# Patient Record
Sex: Male | Born: 1940 | Race: White | Hispanic: No | Smoking: Never smoker
Health system: Southern US, Community
[De-identification: ages and names within clinical notes are randomized; demographics above are authoritative.]

## PROBLEM LIST (undated history)

## (undated) DIAGNOSIS — I10 Essential (primary) hypertension: Secondary | ICD-10-CM

## (undated) DIAGNOSIS — I499 Cardiac arrhythmia, unspecified: Secondary | ICD-10-CM

## (undated) DIAGNOSIS — I2510 Atherosclerotic heart disease of native coronary artery without angina pectoris: Secondary | ICD-10-CM

## (undated) DIAGNOSIS — E785 Hyperlipidemia, unspecified: Secondary | ICD-10-CM

## (undated) HISTORY — DX: Cardiac arrhythmia, unspecified: I49.9

## (undated) HISTORY — DX: Essential (primary) hypertension: I10

## (undated) HISTORY — DX: Atherosclerotic heart disease of native coronary artery without angina pectoris: I25.10

## (undated) HISTORY — DX: Hyperlipidemia, unspecified: E78.5

---

## 2001-10-17 ENCOUNTER — Encounter: Payer: Self-pay | Admitting: Emergency Medicine

## 2001-10-17 ENCOUNTER — Inpatient Hospital Stay (HOSPITAL_COMMUNITY): Admission: EM | Admit: 2001-10-17 | Discharge: 2001-10-21 | Payer: Self-pay | Admitting: Emergency Medicine

## 2002-05-11 ENCOUNTER — Ambulatory Visit (HOSPITAL_COMMUNITY): Admission: RE | Admit: 2002-05-11 | Discharge: 2002-05-12 | Payer: Self-pay | Admitting: Interventional Cardiology

## 2002-05-11 ENCOUNTER — Encounter: Payer: Self-pay | Admitting: Interventional Cardiology

## 2006-07-11 ENCOUNTER — Emergency Department (HOSPITAL_COMMUNITY): Admission: EM | Admit: 2006-07-11 | Discharge: 2006-07-11 | Payer: Self-pay | Admitting: Emergency Medicine

## 2007-10-27 ENCOUNTER — Ambulatory Visit (HOSPITAL_BASED_OUTPATIENT_CLINIC_OR_DEPARTMENT_OTHER): Admission: RE | Admit: 2007-10-27 | Discharge: 2007-10-27 | Payer: Self-pay | Admitting: Orthopedic Surgery

## 2007-11-12 ENCOUNTER — Ambulatory Visit (HOSPITAL_BASED_OUTPATIENT_CLINIC_OR_DEPARTMENT_OTHER): Admission: RE | Admit: 2007-11-12 | Discharge: 2007-11-12 | Payer: Self-pay | Admitting: Orthopedic Surgery

## 2008-01-09 ENCOUNTER — Ambulatory Visit (HOSPITAL_COMMUNITY): Admission: RE | Admit: 2008-01-09 | Discharge: 2008-01-09 | Payer: Self-pay | Admitting: Surgery

## 2009-07-16 ENCOUNTER — Emergency Department (HOSPITAL_COMMUNITY): Admission: EM | Admit: 2009-07-16 | Discharge: 2009-07-16 | Payer: Self-pay

## 2009-10-10 ENCOUNTER — Ambulatory Visit (HOSPITAL_BASED_OUTPATIENT_CLINIC_OR_DEPARTMENT_OTHER): Admission: RE | Admit: 2009-10-10 | Discharge: 2009-10-10 | Payer: Self-pay | Admitting: Orthopedic Surgery
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
## 2010-11-22 LAB — BASIC METABOLIC PANEL
BUN: 12 mg/dL (ref 6–23)
Chloride: 105 mEq/L (ref 96–112)
Creatinine, Ser: 0.9 mg/dL (ref 0.4–1.5)
Glucose, Bld: 113 mg/dL — ABNORMAL HIGH (ref 70–99)
Potassium: 4.4 mEq/L (ref 3.5–5.1)

## 2010-12-06 LAB — DIFFERENTIAL
Basophils Absolute: 0.1 10*3/uL (ref 0.0–0.1)
Eosinophils Absolute: 0.1 10*3/uL (ref 0.0–0.7)
Eosinophils Relative: 1 % (ref 0–5)
Lymphs Abs: 2.1 10*3/uL (ref 0.7–4.0)
Monocytes Absolute: 0.5 10*3/uL (ref 0.1–1.0)

## 2010-12-06 LAB — CBC
HCT: 43.4 % (ref 39.0–52.0)
MCHC: 34.4 g/dL (ref 30.0–36.0)
MCV: 91.3 fL (ref 78.0–100.0)
Platelets: DECREASED 10*3/uL (ref 150–400)
RDW: 13.2 % (ref 11.5–15.5)

## 2010-12-06 LAB — BASIC METABOLIC PANEL
BUN: 19 mg/dL (ref 6–23)
CO2: 26 mEq/L (ref 19–32)
Chloride: 107 mEq/L (ref 96–112)
Glucose, Bld: 101 mg/dL — ABNORMAL HIGH (ref 70–99)
Potassium: 4.2 mEq/L (ref 3.5–5.1)

## 2011-01-16 NOTE — Op Note (Signed)
Tyrone Gonzales, Tyrone Gonzales              ACCOUNT NO.:  [id number]   MEDICAL RECORD NO.:  [id number]          PATIENT TYPE:  AMB   LOCATION:  DSC                          FACILITY:  [facility]   PHYSICIAN:  Feliberto Gottron. Turner Daniels, M.D.   DATE OF BIRTH:  01/10/1941   DATE OF PROCEDURE:  11/12/2007  DATE OF DISCHARGE:                               OPERATIVE REPORT   PREOPERATIVE DIAGNOSIS:  Left carpal tunnel syndrome.   POSTOPERATIVE DIAGNOSIS:  Left carpal tunnel syndrome.   PROCEDURE:  Left carpal tunnel release.   SURGEON:  Feliberto Gottron. Turner Daniels, MD.   FIRST ASSISTANT:  None.   ANESTHETIC:  General LMA.   ESTIMATED BLOOD LOSS:  Minimal.   FLUID REPLACEMENT:  500 ml of crystalloid.   TOURNIQUET TIME:  12 minutes.   INDICATIONS FOR PROCEDURE:  The patient is a 70 year old gentleman, who  underwent a right carpal tunnel release about two weeks ago that  interestingly got rid of both his hand and his shoulder pain.  He has  similar findings on the left side.   DICTATION ENDED AT THIS POINT      Feliberto Gottron. Turner Daniels, M.D.     Ovid Curd  D:  11/12/2007  T:  11/12/2007  Job:  [id number]

## 2011-01-16 NOTE — Op Note (Signed)
Tyrone Gonzales, Tyrone Gonzales              ACCOUNT NO.:  [id number]   MEDICAL RECORD NO.:  [id number]          PATIENT TYPE:  AMB   LOCATION:  DSC                          FACILITY:  [facility]   PHYSICIAN:  Feliberto Gottron. Turner Daniels, M.D.   DATE OF BIRTH:  18-May-1941   DATE OF PROCEDURE:  11/12/2007  DATE OF DISCHARGE:                               OPERATIVE REPORT   PREOPERATIVE DIAGNOSIS:  Carpal tunnel syndrome.   POSTOPERATIVE DIAGNOSIS:  Carpal tunnel syndrome.   PROCEDURE:  Left carpal tunnel release.   SURGEON:  Feliberto Gottron. Turner Daniels, M.D.   ASSISTANT:  None.   ANESTHESIA:  General LMA.   ESTIMATED BLOOD LOSS:  Minimal.   FLUIDS REPLACED:  500 mL of crystalloid.   TOURNIQUET TIME:  12 minutes.   INDICATIONS FOR PROCEDURE:  A 70 year old man who underwent a successful  right carpal tunnel release two weeks ago that got rid of both his hand  and right shoulder neck pain.  He has moderate to severe carpal tunnel  on the left side by EMG and desires similar carpal tunnel release.  Risks and benefits of surgery are well known to the patient.   DESCRIPTION OF PROCEDURE:  The patient was identified by armband and  taken to the operating room at [facility].  Appropriate  anesthetic monitors are attached.  General LMA anesthesia induced with  the patient in supine position.  Tourniquet applied to the left arm and  left upper extremity prepped and draped in the usual sterile fashion  from the fingertips to the tourniquet.  Limb wrapped with an Esmarch  bandage.  The tourniquet inflated to 250 mmHg and I began the procedure  by making a palmar longitudinal incision starting at the wrist flexion  crease, going distally for 3 cm just through the skin and subcutaneous  tissue and down to the transverse palmar fascia distally.  A small nick  was made in the transverse palmar fascia entering the carpal tunnel.  A  Freer elevator was then passed into the carpal tunnel and kept volar.  We cut down  on the [facility] elevator, performed the carpal tunnel release,  and extended the incision up into the forearm fascia with the black  handled scissors.  Having thus released the carpal tunnel, we examined  the median nerve and tendons and found them to be in good condition.  There are no ganglia masses in the tunnel and the wound was irrigated  out with normal saline solution.  We then  closed the skin only with running 4-0 Monocryl suture and a dressing of  Xeroform, 4x4 dressing, sponges, 2-inch Webril, and 2-inch Ace wrap  applied.  The tourniquet let down.  The patient awakened and taken to  the recovery room without difficulty.      Feliberto Gottron. Turner Daniels, M.D.  Electronically Signed     FJR/MEDQ  D:  11/12/2007  T:  11/12/2007  Job:  [id number]

## 2011-01-16 NOTE — Op Note (Signed)
[name], Tyrone Gonzales              ACCOUNT NO.:  [id number]   MEDICAL RECORD NO.:  [id number]          PATIENT TYPE:  AMB   LOCATION:  DSC                          FACILITY:  [facility]   PHYSICIAN:  Feliberto Gottron. Turner Daniels, M.D.   DATE OF BIRTH:  12-07-1940   DATE OF PROCEDURE:  10/27/2007  DATE OF DISCHARGE:                               OPERATIVE REPORT   PREOPERATIVE DIAGNOSIS:  Right carpal tunnel syndrome.   POSTOPERATIVE DIAGNOSIS:  Right carpal tunnel syndrome.   PROCEDURE:  Right carpal tunnel release.   SURGEON:  Feliberto Gottron.  Turner Daniels, MD.   FIRST ASSISTANT:  None.   ANESTHETIC:  General LMA.   ESTIMATED BLOOD LOSS:  Minimal.   FLUID REPLACEMENT:  500 mL crystalloid.   TOURNIQUET TIME:  10 minutes.   INDICATIONS FOR PROCEDURE:  [name] with EMG-proven  severe right and left carpal tunnel syndrome with a continuous numbness  and tingling to the fingers of his right hand and pain that wakes him up  at night.  He desires elective right carpal tunnel release.  Risks and  benefits of surgery discussed, questions answered.   DESCRIPTION OF PROCEDURE:  The patient identified by armband, taken to  the operating room at [facility].  Appropriate anesthetic monitors were attached and general LMA  anesthesia induced with the patient in supine position.  Tourniquet  applied to the right form and the right upper extremity prepped and  draped in the usual sterile fashion from the fingertips to the  tourniquet, limb wrapped with an Esmarch bandage, tourniquet inflated to  300 mmHg.  We began the procedure by making a palmar midline incision  starting at the wrist flexion crease, going just to the ulnar side of  the thenar crease over a distance of about 4 cm.  Small bleeders in the  skin and subcutaneous tissue identified and cauterized using Senn  retractors, traction was applied to the skin identifying the transverse  palmar fascia  distally.  This was incised just into the carpal tunnel  longitudinally, allowing passage of a Freer elevator into the volar  aspect of the carpal tunnel.  We then cut down on the [facility] elevator,  performing the carpal tunnel release and then ran the fascial incision  up into the forearm fascia with a black-handled scissors.  We then  evaluated the median nerve which did have an hour glass configuration as  it went under the transverse carpal ligament and the flexor tendons were  noted to be intact.  At this point, the wound  was irrigated out with normal saline solution and the skin only was  closed with running 5-0 nylon suture, dressing of Xeroform, 4x4 dressing  sponges, 2-inch Webril, 2-inch Ace wrap was then applied.  Tourniquet  let down.  The patient awakened and taken to the recovery room without  difficulty.      Feliberto Gottron. Turner Daniels, M.D.  Electronically Signed     FJR/MEDQ  D:  10/27/2007  T:  10/27/2007  Job:  [id number]

## 2011-01-16 NOTE — Op Note (Signed)
NAMEJERMARION, Tyrone Gonzales              ACCOUNT NO.:  [id number]   MEDICAL RECORD NO.:  [id number]          PATIENT TYPE:  AMB   LOCATION:  SDS                          FACILITY:  [facility]   PHYSICIAN:  Tyrone Gonzales, M.D. DATE OF BIRTH:  Oct 25, 1940   DATE OF PROCEDURE:  01/09/2008  DATE OF DISCHARGE:  01/09/2008                               OPERATIVE REPORT   PREOPERATIVE DIAGNOSIS:  Recurrent right inguinal hernia.   POSTOPERATIVE DIAGNOSIS:  Recurrent right inguinal hernia.   PROCEDURE PERFORMED:  Open repair of recurrent right inguinal hernia  with mesh.   SURGEON:  Tyrone Arms. Corliss Skains, MD   ANESTHESIA:  General.   INDICATIONS:  The patient is a 70 year old male who underwent an open  right inguinal hernia repair in 1983.  Over the last 3-4 years, the  patient has noticed enlarging bulge in his right groin which has caused  some discomfort.  He presents now for elective repair.   DESCRIPTION OF PROCEDURE:  The patient was brought to the operating  room, placed in a supine position on the operating room table.  After an  adequate level of general anesthesia was obtained, the patient's right  groin was shaved, prepped with Betadine and draped in sterile fashion.  A time-out was taken to ensure the proper patient and proper procedure.  An incision was made above the right inguinal ligament after  infiltrating with 0.25% Marcaine with epinephrine.  Dissection was  carried down into the subcutaneous tissues with cautery.  We encountered  some scar tissue superficial to the external oblique fascia.  We cleared  off all the scar tissue and exposed the external oblique fascia.  There  is a large hernia sac seen protruding through the external ring.  We  opened the external oblique fascia down to the external ring.  We were  able to bluntly dissect around the spermatic cord.  This was retracted  with a Penrose drain.  A very large indirect hernia sac was identified.  This was peeled off  of the spermatic cord.  We had to loosen up the  internal ring slightly to allow reduction of the entire hernia sac.  The  internal ring was then tightened with a 2-0 Vicryl suture.  We then  examined the floor of the inguinal canal.  There was another direct  defect.  We dissected the hernia sac free and reduced this up through  the direct defect.  The floor of the inguinal canal was reapproximated  with a 0 PDS suture.  UltraPro mesh was cut in the keyhole shape and  secured with 2-0 Prolene beginning at the pubic tubercle.  We ran this  along Cooper's ligament and the internal oblique fascia.  The tails were  sutured together behind the spermatic cord and tucked underneath the  external oblique fascia.  The external oblique was then reapproximated  with 2-0 Vicryl, 3-0 Vicryl was used to close the subcutaneous tissues  and  4-0 Monocryl was used to close the skin.  The Steri-Strips and clean  dressings were applied.  The patient was extubated and brought to the  recovery room in stable condition.  All sponge, instrument, and needle  counts were correct.      Tyrone Arms. Tsuei, M.D.  Electronically Signed     MKT/MEDQ  D:  01/09/2008  T:  01/09/2008  Job:  [id number]   cc:   Lyn Records, M.D.
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
## 2011-01-19 NOTE — Cardiovascular Report (Signed)
[place]. [facility]  Patient:    Tyrone, Gonzales Visit Number: [id number] MRN: [id number]          Service Type: MED Location: [facility]UA [id number] Attending Physician:  Corliss Marcus Dictated by:   Francisca December, M.D. Proc. Date: 10/17/01 Admit Date:  10/17/2001   CC:         Tyrone Gonzales, M.D.  Tyrone Gonzales, M.D.   Cardiac Catheterization  PROCEDURES PERFORMED. 1. Left heart catheterization. 2. Coronary angiography. 3. Left ventriculogram. 4. Percutaneous coronary thrombectomy distal right femoral artery. 5. Percutaneous transluminal coronary angioplasty and stent implantation    distal right coronary artery. 6. Percutaneous transluminal coronary angioplasty and stent implantation    proximal right coronary artery.  SURGEON:  Francisca December, M.D.  INDICATIONS:  Mr. Tyrone Gonzales is a 70 year old male with known atherosclerotic cardiovascular disease now seven years status post PTCA and stent implantation of the large circumflex marginal branch.  He has done well until approximately 7:30 this morning when he developed the acute onset of anterior substernal crushing chest pain.  He presented to [facility] Emergency Room within two hours with diaphoresis, chest pain, and nausea.  He was having an acute inferior wall myocardial infarction by ECG.  He was transferred urgently to the cardiac catheterization laboratory for anticipated direct angioplasty.  PROCEDURE NOTE:  The patient was brought to the cardiac catheterization laboratory where both groins were prepped and draped in the usual sterile fashion.  Local anesthesia was obtained with the infiltration of 1% lidocaine into the right femoral canal.  A 7-French catheter sheath was introduced percutaneously into the right femoral artery utilizing an anterior approach over a guiding J wire through an 18-gauge thin-wall Gonzales, utilizing an anterior approach.  A 5-French #4 right Judkins catheter  was advanced to the ascending aorta where coronary angiography of the right coronary artery was conducted in multiple RAO and LAO projections.  This catheter was exchanged for a 5-French #4 left Judkins catheter.  Cine angiography of the left coronary artery was conducted in multiple LAO and RAO projections.  The left Judkins catheter was exchanged for a 5-French 110 cm pigtail catheter.  This was used to measure pressures within the ascending aorta and in the left ventricle both prior to and following the ventriculogram.  A 30-degree RAO cine left ventriculogram was performed utilizing the power injector. We then proceeded with coronary intervention.  The pigtail catheter was exchanged for a 7-French #4 FR Medtronic Launcher guiding catheter.  The right coronary os was engaged, and a 0.014 inch Scimed Luge intracoronary guidewire was passed across the lesion in the distal right coronary without difficulty. Initial patency in the artery was achieved using the pulseless AngioJet XMI catheter.  Three passes were performed from distal to proximal.  Prior to this, a temporary pacing wire had been placed in the right ventricular apex via a 6-French catheter sheath inserted into the right femoral vein utilizing an anterior approach over a guiding J wire.  The patient did require some pacing backup at 60 beats per minute during the pulseless AngioJet.  These maneuvers resulted in restoration of antegrade flow in the right coronary, and it revealed a rather focal dissection in the distal portion of the right coronary before the bifurcation in the posterolateral segment and posterior descending artery, both of which were rather small.  A 2.5/13 mm ACS Guidant Pixel intracoronary stent was advanced into the distal right coronary and deployed there at 14  atmospheres for approximately 1 minute.  This resolved the dissection, was otherwise patent, and resulted in wide patency of the distal right  coronary.  At this point, that balloon catheter was removed, and a 3.0/8 mm Scimed Express 2 intracoronary stent was advanced into a 75% stenosis in the proximal portion of the right coronary. That was deployed there at peak pressure of 16 atmospheres for 45 seconds. During this time, the patient received multiple intracoronary injections of nitroglycerin.  He required intermittent pacing backup at 50 to 60 beats per minute.  There was some relative hypertension.  I did administer 1 mg of atropine.  He also received 5000 units of heparin in the emergency room, 2000 units of heparin after arriving in the catheterization laboratory when his ACT was 229 seconds; and at close, his ACT was 202 seconds, and he received an additional 2000 units of heparin.  He also received a double bolus of Integrilin in constant infusion.  Finally, he was randomized in the AME trial, two pulseless AngioJet after getting informed consent prior to initiation of the procedure and prior to the administration of any intravenous benzodiazepines or narcotics.  The patient tolerated the procedure well with some transient hypertension that resolved by the completion of the procedure.  There was resolution of his ST segment elevation.  He was transported to the recovery area in stable condition with an intact distal pulse.  The sheaths remained sutured in place.  HEMODYNAMICS:  Systemic arterial pressure was 121/77 with a mean of 99 mmHg. There was no systolic gradient across the aortic valve.  The left ventricular end-diastolic pressure was 23 mmHg pre ventriculogram and 19 mmHg post ventriculogram.  ANGIOGRAPHY:  The left ventriculogram demonstrated normal chamber size and normal global systolic function with estimated ejection fraction of 70%. There was inferobasal-to-mid-portion-of-the-LV akinesis.  There was no significant mitral regurgitation.  There was some left coronary calcification, and the previously  placed stent was easily visible.   There was a right-dominant coronary system present.  The main left coronary artery was normal.  The left anterior descending artery and its branches were mildly diffusely diseased.  Two small diagonal branches arise proximally and then a moderate to large size third diagonal branch arises at the origin of the second septal perforator.  The anterior descending artery ongoing is diffusely diseased but not greater than 30 to 40% in the mid to distal segment.  The vessel does traverse the apex.  Luminal irregularities only are seen in the proximal segment.  The left circumflex artery and its branches were mildly diseased; there is a very small ramus intermedius branch.  Then a small first marginal branch arises.  No significant obstruction is seen in these vessels.  The stented segment is in the proximal portion of a very large and dominant marginal branch.  Just distal to this stent, there is a 30 to 40% stenosis.  The vessel then trifurcates on the lateral obtuse margin of the heart, the more superior subbranch of which is diffusely diseased and has multiple 50 to 60% narrowings.  The right coronary artery and its branches are highly diseased; the vessel has a proximal 30% stenosis followed immediately by a 75% stenosis.  The mid portion of the vessel is diffusely diseased, at least 50%, and extends over about 2 cm.  The distal portion of the right coronary is the location of the index infarction lesion which is 99% stenotic, and there is evidence of vascular thrombus.  The vessel bifurcates into  a small posterolateral segmented branch and into the posterior descending artery which is moderate in size.  Following pulseless AngioJet thrombectomy and stent implantation in the distal portion of the right coronary, there is no significant residual stenosis and TIMI-3 flow.  Following stent implantation in the proximal 75% stenosis, there is no residual  narrowing.  The vessel remains diffusely diseased in the mid portion.  I did not treat this segment in order to avoid a "full metal jacket" and higher likelihood of restenosis in this setting.  Finally, it should be noted that, when I initially did angiography, there was no distal flow, and the vessel was completely occluded in the distal segment. This was TIMI-0 flow.  FINAL DIAGNOSES: 1. Atherosclerotic cardiovascular disease, two vessel. 2. Acute inferior wall myocardial infarction successful treated with direct    percutaneous transluminal coronary angioplasty. 3. Successful pulseless AngioJet thrombectomy and stent implantation in the    distal right coronary. 4. Successful stent implantation proximal right coronary artery. 5. Intact left ventricular size and global systolic function with regional    wall motion abnormality as noted. 6. The patients chest discomfort was relieved following this procedure.  FINAL IMPRESSION: 1. Nonobstructive atherosclerotic cardiovascular disease. 2. Intact left ventricular size and global systolic function with mild    regional wall motion abnormalities noted. 3. Normal left ventricular end diastolic pressure. 4. Mild systemic hypertension.  PLAN/RECOMMENDATIONS: It does not appear that the patients out-of-hospital cardiac arrest can be attributed to coronary ischemia.  Will obtain an electrophysiologic consultation for consideration of EP study and likely AICD implant. Dictated by:   Francisca December, M.D. Attending Physician:  Corliss Marcus DD:  10/17/01 TD:  10/17/01 Job: [id number]

## 2011-01-19 NOTE — Cardiovascular Report (Signed)
NAME:  Tyrone Gonzales, Tyrone Gonzales                        ACCOUNT NO.:  [id number]   MEDICAL RECORD NO.:  [id number]                   PATIENT TYPE:  OIB   LOCATION:  6525                                 FACILITY:  [facility]   PHYSICIAN:  Lyn Records III, M.D.            DATE OF BIRTH:  27-Jan-1941   DATE OF PROCEDURE:  05/11/2002  DATE OF DISCHARGE:                              CARDIAC CATHETERIZATION   INDICATIONS FOR PROCEDURE:  The patient has a history of coronary artery  disease and is status post circumflex coronary stent in 1996 and is also  status post proximal and distal RCA stent implantation during acute inferior  infarction by Dr. Amil Amen in February 2003.  He is asymptomatic at this time  but because of the high risk of restenosis in the right coronary, I have  chosen coronary angiography as a means to rule out restenosis.   PROCEDURE PERFORMED:  1. Left heart catheterization.  2. Selective coronary angiography.  3. Left ventriculography.  4. Direct stent, distal right coronary artery for high-grade restenosis.   DESCRIPTION OF PROCEDURE:  After informed consent, a #6 French sheath was  placed in the right femoral artery using the modified Seldinger technique.  A #6 French A2 multipurpose catheter was used for hemodynamic recordings,  left ventriculography, and selective left and right coronary angiography.  Intracoronary nitroglycerin, 200 mcg, was administered down the right  coronary with a #6 French sidehole guide catheter.  After reviewing the  cineangiograms, it was felt that there was still moderately severe disease  in the mid RCA and that there was a high-grade 99% stenosis at the distal  margin of the distal RCA stent.  This did not appear to be in-stent but was  within the probable treatment zone and is likely due to trauma from the  prior interventional procedure in February.   After reviewing the cineangiograms, it was felt that PCI on the distal RCA  was  indicated.  A BMW wire was used.  Heparin, 3500 units, was administered  intravenously and a double bolus followed by an infusion of Integrilin was  started.  Direct stenting with a 2.25 mm diameter, 8 mm long Pixel stent was  performed with inflation to a peak pressure of 15 atmospheres which was an  effective stent diameter of 2.4 mm.  The patient tolerated the procedure  without complications.  ACT post procedure was 272 seconds.   In the process of taking post stent angiograms, the shield covering became  entangled in the patient's sheath and pulled the sheath out of the patient's  artery.  Manual hemostasis was started in the catheterization lab.  A  FemStop was placed.  The patient will be watched in the catheterization  holding area until the ACT is in a range that would ordinarily allow sheath  removal and hemostasis is assured.   RESULTS:  1. Hemodynamic data     A. Aortic  pressure 141/84.     B. Left ventricular pressure 143/6.  2. Left ventriculography:  The left ventricle is normal in size.  There is     inferior hypokinesis.  EF 60%.  3. Coronary angiography     A. Left main coronary:  Normal.     B. Left anterior descending coronary:  Luminal irregularities.  Two        diagonals arise from it.  No high-grade obstruction is seen.     C. Circumflex artery:  The circumflex is diffusely diseased.  There is a        relatively long mid circumflex stent that is widely patent.  Beyond        this stent in the distal circumflex is diffuse disease with 50-70%        narrowing.  Following this segment, the circumflex trifurcates.  The        mid obtuse marginal branch of this trifurcation is diffusely diseased        with up to 90% stenosis in multiple spots.     D. Right coronary artery:  The right coronary artery contains 50-70% mid        vessel narrowing just distal to a large acute marginal branch.  The        proximal RCA stent is widely patent.  The distal RCA stent is  widely        patent and there is 99% stenosis at the distal margin of this stent.        The PDA is diffusely diseased with up to 90% stenosis in some        branches.  4. Percutaneous coronary intervention:  Following direct stenting, the     distal right coronary stenosis was reduced from 99% to 0% with brisk     antegrade flow.  After reviewing the cineangiograms, I did give some     consideration to stenting the mid right coronary but I chose not to and     will simply follow this lesion along.   CONCLUSION:  1. Severe coronary artery disease with diffuse obtuse marginal #2 disease,     moderate distal circumflex disease, and high-grade distal right coronary     artery stenosis.  There is also moderate mid right coronary disease.  2. Overall normal left ventricular function.  3. Successful direct stent, distal right coronary artery, for 99% to 0%.  4. Procedure was complicated by premature removal of the arterial sheath     requiring that manual compression be started at the completion of the     case and FemStop will also be used.   PLAN:  Clinical followup of the right coronary.  Continue other medications.  Ticlid will be used in lieu of Plavix as the patient has a possible Plavix  allergy.  Integrilin x 18 hours.                                               [name], M.D.    HWS/MEDQ  D:  05/11/2002  T:  05/11/2002  Job:  [id number]   cc:   Aura Dials, M.D.   Annia Belt, M.D.

## 2011-01-19 NOTE — H&P (Signed)
[place]. [facility]  Patient:    Tyrone Gonzales, Tyrone Gonzales Visit Number: [id number] MRN: [id number]          Service Type: MED Location: [id number] [id number] Attending Physician:  Lorre Nick Dictated by:   Anselm Lis, N.P. Admit Date:  10/17/2001   CC:         Dr. Wynema Birch in [place]                         History and Physical  DATE OF BIRTH:  1941/09/03  PRIMARY CARE Malana Eberwein:  Dr. Wynema Birch in [place], phone number [phone].  PRIMARY CARDIOLOGIST:  Dr. Darci Needle.  IMPRESSION:  (as dictated by Dr. Corliss Marcus) 1. Acute inferior myocardial infarction in a 70 year old gentleman with known    history of coronary artery disease in that he is status post stent to    the circumflex in 1996.  He does have residual nonobstructive left    anterior descending artery and right coronary artery disease with normal    ejection fraction.  He has a positive family history of coronary artery    disease, personal history of dislipidemia and hypertension.  He    continues with chest discomfort, onset about 7:15 a.m.; time of evaluation    approximately 9 a.m.  He is status post 5000 IV heparin bolus,    nitroglycerin sublingual, IV nitroglycerin drip, and 150 mg of Plavix. 2. History of dislipidemia; on Lipitor, managed by primary care. 3. Recent onset of bronchitis for which he is taking amoxicillin and    guaifenesin.  PLAN:  (as dictated by Dr. Corliss Marcus) Patient taken urgently to cardiac catheterization for coronary angiography, left ventriculogram, and probable percutaneous intervention if indicated and able.  Risks, potential complications, benefits, and alternatives to procedure discussed with Tyrone Gonzales.  Patient indicates his questions and concerns have been addressed and is agreeable to proceed.  HISTORY OF PRESENT ILLNESS:  Tyrone Gonzales is a 70 year old gentleman with khx of CAD (stent circumflex 1996), positive family  history of CAD, and personal history of dislipidemia and hypertension.  He developed onset of diaphoresis/shortness of breath/across anterior chest discomfort with a feeling of malaise.  Pain described as a 10/10.  He called a neighbor and then subsequently 911.  He did take a couple of sublingual nitrates without appreciable change.  Enroute he received four baby aspirin.  At [facility] emergency room EKG changes were consistent with acute inferior myocardial infarction with reciprocal changes.  He was given 5000 IV heparin, sublingual nitrates, IV morphine, and IV nitro drip.  He was given 150 mg of Plavix.  He was taken urgently for coronary angiography.  PREVIOUS MEDICAL HISTORY: 1. Coronary atherosclerotic heart disease.    a. (August 1996) Acute myocardial infarction with subsequent stent in       the proximal circumflex.  He had residual 70% distal LAD, 50% proximal       LAD.  Diffusely diseased proximal to mid RCA up to 50%.  EF 70%.  No MR.       Mild inferior hypokinesis.    b. Stress Cardiolite February 2002 which was negative for ischemia.       EF 69%. 2. History of dislipidemia. 3. History of hypertension for seven years.  PAST SURGICAL HISTORY:  Was not obtained but patient states is extensive.  ALLERGIES:  CODEINE, causing nausea.  MEDICATIONS: 1. Amoxicillin 500 mg one p.o.  t.i.d. started October 14, 2001. 2. Metoprolol 50 mg one-half p.o. b.i.d. 3. Guaifenesin /DM one to two p.o. b.i.d. p.r.n. congestion. 4. Prevacid 15 mg p.o. q.d. 5. Lipitor 20 mg p.o. q.d. 6. Aspirin 81 mg p.o. q.d. 7. Sublingual nitrate 0.4 mg p.r.n. 8. P.r.n. Viagra.  SOCIAL HISTORY AND HABITS:  Patient is divorced.  No children.  He has a friend, Creta Levin, that works at [employer].  His brother, Dequavious Harshberger, lives in [place], I believe.  FAMILY HISTORY:  Positive for stroke and CAD.  REVIEW OF SYSTEMS:  Some bronchitis-type symptoms for which he has been  taking guaifenesin and amoxicillin, started October 10, 2001.  He had previously been complaining of burning chest discomfort, getting better with antacids and not exertion related.  Denies orthopnea, DOE.  PHYSICAL EXAMINATION:  (as performed by Dr. Corliss Marcus)  VITAL SIGNS:  Blood pressure 102/60, heart rate 50s, respiratory rate 18.  GENERAL:  He is a well-nourished, anxious-appearing, pale 70 year old gentleman with ongoing chest discomfort.  HEENT:  Brisk bilateral carotid upstroke without bruit.  No significant JVD nor thyromegaly.  CARDIAC:  Regular rate and rhythm without murmur, rub, nor gallop.  Normal S1 and S2.  CHEST:  Lung sounds clear with equal bilateral excursion.  ABDOMEN:  Soft, nondistended, normal active bowel sounds.  Negative abdominal aortic, renal, nor femoral bruit appreciated.  No masses nor organomegaly. Nontender to applied pressure.  EXTREMITIES:  Pedal pulses decreased.  Negative pedal edema.  Lower extremities are cool, as is the rest of his body.  NEUROLOGIC:  Cranial nerves II-XII grossly intact.  Alert and oriented x 3.  GENITAL/RECTAL:  Deferred.  LABORATORY TESTS AND DATA:  Sodium 138, potassium 4.0, chloride 106, CO2 24, BUN 10, creatinine 1.1, glucose 125.  Hemoglobin 14.8, hematocrit 42, WBC 9.6, platelets 237.  Protime 14.4, INR 1.2, PTT 30.  CK 33.  Chest x-ray revealed no active disease.  EKG revealed sinus bradycardia at 54 beats per minute with ST elevation inferior and lateral/precordial leads consistent with acute inferior MI. Reciprocal ST depression I, aVL, and anterior leads. Dictated by:   Anselm Lis, N.P. Attending Physician:  Lorre Nick DD:  10/17/01 TD:  10/17/01 Job: [id number]

## 2011-01-19 NOTE — Discharge Summary (Signed)
[place]  Patient:    Tyrone Gonzales, Tyrone Gonzales Visit Number: [id number] MRN: [id number]          Service Type: MED Location: 2000 2004 01 Attending Physician:  Tyrone Gonzales Dictated by:   Tyrone Gonzales, N.P. Admit Date:  10/17/2001 Discharge Date: 10/21/2001   CC:         Tyrone Gonzales, M.D., [place], [place] - Urgent Care   Discharge Summary  PRIMARY CARE PHYSICIAN:  Tyrone Gonzales, Tyrone Gonzales, Tyrone Gonzales.  DATE OF BIRTH:  02-Mar-1941.  PROCEDURES:  (October 17, 2001) Stent distal RCA (AngioJet thrombectomy) and stent proximal RCA.  Residual 50% mid-RCA and 50-60% superior OM.  Preserved LV function; inferior akinesis.  EF 70%.  No MR.  DISCHARGE DIAGNOSES: 1. Coronary atherosclerotic heart disease.    a. Acute inferior myocardial infarction with peak CK of 1208, MB fraction       144, troponin I of 11.9.    b. (October 17, 2001) Stent distal right coronary artery (AngioJet       thrombectomy).  Stent proximal right coronary artery.  Residual 50%       mid right coronary artery, 50-60% superior obtuse marginal.    c. At time of catheterization left ventriculogram revealed preserved left       ventricular function with inferior akinesis, ejection fraction 70%.       No mitral regurgitation.  Medical therapy on beta blocker,       angiotensin-converting enzyme inhibitor, aspirin, Plavix, and statin. 2. Hypotension during course of admission likely related to ACE inhibitor    and beta blocker therapy.  Blood pressure as low as 70s post procedure.    Improved with decrease of drug therapy and stable at time of discharge. 3. History of dyslipidemia; on Zocor. 4. Skin rash question related to amoxicillin of which the patient was on prior    to admission.  It could also be related to contrast dye.  Amoxicillin    discontinued at time of discharge.  If he continues with this rash we will    plan to discontinue Plavix as possible cause  for it and switch to Ticlid. 5. Prehospital bronchitis; was started on amoxicillin prior to admission on    October 14, 2001.  He continued on amoxicillin during the course of    admission but this was discontinued at time of discharge because of    appearance of rash with amoxicillin as possible culprit.  His bronchitis    has significantly improved/resolved by time of discharge.  PLAN: 1. The patient discharged home in stable condition. 2. Discharge medications:    a. (NEW) Nitroglycerin tablet 0.4 mg sublingual p.r.n. chest pain.    b. Prevacid 15 mg p.o. q.d.    c. Lipitor 20 mg p.o. q.d.    d. Enteric-coated aspirin 325 mg once daily.    e. (NEW) Plavix 75 mg one p.o. q.d. take with food for 4 weeks.    f. Guaifenesin-DM one to two tabs b.i.d. p.r.n. cough.    g. (NEW) Lisinopril 5 mg p.o. q.d.    h. (NEW) Toprol XL 25 mg one p.o. q.d. (substitute for metoprolol). 3. Activity: As outlined by cardiac rehab nurse.  Recommend enrollment in    cardiac rehab phase II as an outpatient. 4. Diet: Low fat, low cholesterol. 5. Wound care: The patient to call our office if he develops a large amount of    swelling or bruising in groin area.  He will call us if his rash persists    or worsens with consideration of changing from Plavix to Ticlid as this may    be the culprit. 6. He is not to work until he is seen by Dr. Katrinka Gonzales. 7. Other special instructions: The patient to discontinue his amoxicillin. 8. Followup: Dr. Verdis Gonzales; our office will call him with that followup    office visit to be seen in about 10-14 days.  HISTORY OF PRESENT ILLNESS:  The patient is a 70 year old gentleman with known history of CAD and that he is status post stent of the circumflex in 1996. The patient developed onset of diaphoresis/shortness of breath and across anterior chest discomfort with a feeling of associated malaise.  His pain was described as a 10/10.  He called a neighbor and then subsequently  911.  He did take a couple of sublingual nitrates without appreciable change.  He received four baby aspirin en route.  [facility] he had EKG changes consistent with acute inferior myocardial infarction with reciprocal changes.  He was given IV heparin bolus, sublingual nitrates, IV morphine, and IV nitroglycerin drip.  He was also given 150 of Plavix.  He was taken urgently for coronary angiography.  HOSPITAL COURSE:  Further hospital course as above.  LABORATORY TESTS AND DATA:  Initial WBC of 9.6, hemoglobin 14.8; 12.3 at time of discharge.  Admission hematocrit of 42.  Platelets of 237; 201 at time of discharge.  Admission prothrombin time of 14.4, INR of 1.2, PTT 30.  Sodium 138, potassium of 4, chloride of 106, CO2 26, BUN 10, creatinine 1.1, glucose of 125.  Electrolytes within normal range at time of discharge.  First CK of 88, MB fraction 2.3, troponin I 0.01; second CK of 1208, MB fraction 143.7; third CK of 737, MB fraction 62.5; fourth CK of 566, MB fraction 35.4; 566, MB fraction 35.4.  Admission EKG revealed sinus bradycardia at 54 beats a minute with ST elevation inferior and lateral/precordial leads consistent with acute inferior myocardial infarction.  Reciprocal ST depression I, aVL, and anterior leads. Chest x-ray revealed no active disease.  Total time preparing this discharge greater than 40 minutes including filling out and reviewing of discharge instructions with the patient and dictating this discharge plan. Dictated by:   Tyrone Gonzales, N.P. Attending Physician:  Tyrone Gonzales DD:  10/21/01 TD:  10/21/01 Job: [id number]

## 2011-05-25 LAB — I-STAT 8, (EC8 V) (CONVERTED LAB)
Chloride: 107
HCT: 44
pCO2, Ven: 37.9 — ABNORMAL LOW
pH, Ven: 7.418 — ABNORMAL HIGH

## 2011-05-28 LAB — POCT I-STAT, CHEM 8
Chloride: 106
Creatinine, Ser: 0.9
Glucose, Bld: 110 — ABNORMAL HIGH
Hemoglobin: 14.6
Potassium: 4

## 2011-05-30 LAB — CBC
HCT: 42.7
MCHC: 34.3
MCV: 90.8
Platelets: 201
RDW: 13.1

## 2011-05-30 LAB — BASIC METABOLIC PANEL
BUN: 7
Chloride: 105
Glucose, Bld: 110 — ABNORMAL HIGH
Potassium: 4.1

## 2011-05-30 LAB — DIFFERENTIAL
Basophils Absolute: 0.1
Lymphs Abs: 1.6
Monocytes Absolute: 0.8

## 2013-10-04 ENCOUNTER — Other Ambulatory Visit: Payer: Self-pay | Admitting: *Deleted

## 2013-10-04 DIAGNOSIS — Z79899 Other long term (current) drug therapy: Secondary | ICD-10-CM

## 2013-10-04 DIAGNOSIS — E7800 Pure hypercholesterolemia, unspecified: Secondary | ICD-10-CM

## 2013-11-10 ENCOUNTER — Other Ambulatory Visit (INDEPENDENT_AMBULATORY_CARE_PROVIDER_SITE_OTHER): Payer: Medicare Other

## 2013-11-10 DIAGNOSIS — Z79899 Other long term (current) drug therapy: Secondary | ICD-10-CM

## 2013-11-10 DIAGNOSIS — E7800 Pure hypercholesterolemia, unspecified: Secondary | ICD-10-CM

## 2013-11-10 LAB — LIPID PANEL
CHOL/HDL RATIO: 3
Cholesterol: 108 mg/dL (ref 0–200)
HDL: 38.4 mg/dL — AB (ref >39.00)
LDL CALC: 46 mg/dL (ref 0–99)
TRIGLYCERIDES: 120 mg/dL (ref 0.0–149.0)
VLDL: 24 mg/dL (ref 0.0–40.0)

## 2013-11-10 LAB — HEPATIC FUNCTION PANEL
ALBUMIN: 4.2 g/dL (ref 3.5–5.2)
ALT: 23 U/L (ref 0–53)
AST: 18 U/L (ref 0–37)
Alkaline Phosphatase: 56 U/L (ref 39–117)
BILIRUBIN TOTAL: 0.8 mg/dL (ref 0.3–1.2)
Bilirubin, Direct: 0.1 mg/dL (ref 0.0–0.3)
Total Protein: 6.8 g/dL (ref 6.0–8.3)

## 2013-11-12 ENCOUNTER — Telehealth: Payer: Self-pay

## 2013-11-12 DIAGNOSIS — E785 Hyperlipidemia, unspecified: Secondary | ICD-10-CM

## 2013-11-12 NOTE — Telephone Encounter (Signed)
pt given lab results.At target. Recheck 1 year.pt verbalized understanding

## 2013-11-12 NOTE — Telephone Encounter (Signed)
Message copied by Jarvis NewcomerPARRIS-GODLEY, LISA S on Thu Nov 12, 2013  2:43 PM ------      Message from: Verdis PrimeSMITH, HENRY      Created: Thu Nov 12, 2013 11:39 AM       At target. Recheck 1 year ------

## 2014-03-04 DIAGNOSIS — I2510 Atherosclerotic heart disease of native coronary artery without angina pectoris: Secondary | ICD-10-CM | POA: Insufficient documentation

## 2014-03-04 DIAGNOSIS — I25119 Atherosclerotic heart disease of native coronary artery with unspecified angina pectoris: Secondary | ICD-10-CM

## 2014-03-04 DIAGNOSIS — R0600 Dyspnea, unspecified: Secondary | ICD-10-CM

## 2014-03-04 DIAGNOSIS — E785 Hyperlipidemia, unspecified: Secondary | ICD-10-CM | POA: Insufficient documentation

## 2014-03-04 DIAGNOSIS — I10 Essential (primary) hypertension: Secondary | ICD-10-CM | POA: Insufficient documentation

## 2014-03-16 ENCOUNTER — Encounter (INDEPENDENT_AMBULATORY_CARE_PROVIDER_SITE_OTHER): Payer: Self-pay

## 2014-03-16 ENCOUNTER — Ambulatory Visit (INDEPENDENT_AMBULATORY_CARE_PROVIDER_SITE_OTHER): Payer: Medicare Other | Admitting: Interventional Cardiology

## 2014-03-16 ENCOUNTER — Encounter: Payer: Self-pay | Admitting: Interventional Cardiology

## 2014-03-16 VITALS — BP 132/74 | HR 91 | Ht 65.0 in | Wt 159.0 lb

## 2014-03-16 DIAGNOSIS — R0989 Other specified symptoms and signs involving the circulatory and respiratory systems: Secondary | ICD-10-CM

## 2014-03-16 DIAGNOSIS — E785 Hyperlipidemia, unspecified: Secondary | ICD-10-CM

## 2014-03-16 DIAGNOSIS — I2510 Atherosclerotic heart disease of native coronary artery without angina pectoris: Secondary | ICD-10-CM

## 2014-03-16 DIAGNOSIS — I10 Essential (primary) hypertension: Secondary | ICD-10-CM

## 2014-03-16 DIAGNOSIS — R0600 Dyspnea, unspecified: Secondary | ICD-10-CM

## 2014-03-16 DIAGNOSIS — R0609 Other forms of dyspnea: Secondary | ICD-10-CM

## 2014-03-16 MED ORDER — SPIRONOLACTONE 25 MG PO TABS: ORAL_TABLET | ORAL | Status: DC

## 2014-03-16 NOTE — Patient Instructions (Signed)
Your physician has recommended you make the following change in your medication:   1. Start Aldactone 25 mg 1/2 tablet for 1 week and then increase to 1 full tablet if tolerating medication.  Your physician recommends that you return for lab work in 1 month at [facility].  Your physician recommends that you schedule a follow-up appointment in: 1 months with Dr. Katrinka BlazingSmith, Bmet before visit.

## 2014-03-16 NOTE — Progress Notes (Signed)
Patient ID: Tyrone Gonzales, male   DOB: [birth year], 73 y.o.   MRN: [id number]    [address] [place], [place]  [address] Phone: [phone] Fax:  [phone]  Date:  03/16/2014   ID:  Tyrone Gonzales, DOB [birth year], MRN [id number]  PCP:  No primary provider on file.   ASSESSMENT:  1. Coronary atherosclerosis, no angina 2. Dyspnea 3. Hypertension 4. Hyperlipidemia 5. Brief PSVT  PLAN:  1. Start Aldactone 12.5 mg daily for one week and if tolerating, increased to 25 mg per day 2. Office visit in one month 3. Basic metabolic panel in 1 month prior to the office visit   SUBJECTIVE: Tyrone Gonzales is a 73 y.o. male who continues to complain of exertional dyspnea. He's had an extensive pulmonary and allergy evaluation and no abnormalities were found. He is worried that he has heart failure. He denies angina. There is no orthopnea or edema. He is also concerned about the possibility of pulmonary hypertension. He denies palpitations and syncope. No neurological complaints.   Wt Readings from Last 3 Encounters:  03/16/14 159 lb (72.122 kg)     Past Medical History  Diagnosis Date  . Hypertension   . Hyperlipidemia   . Coronary artery disease     BMS Cfx.,1996, RCA BMS 2003  . Arrhythmia     PSVT during stress test    Current Outpatient Prescriptions  Medication Sig Dispense Refill  . aspirin 325 MG EC tablet Take 325 mg by mouth daily.      [name]. atorvastatin (LIPITOR) 20 MG tablet Take 20 mg by mouth daily.      [name]. lisinopril (PRINIVIL,ZESTRIL) 5 MG tablet Take 5 mg by mouth daily.      . metoprolol succinate (TOPROL-XL) 25 MG 24 hr tablet Take 25 mg by mouth daily.      . nitroGLYCERIN (NITROSTAT) 0.4 MG SL tablet Place 0.4 mg under the tongue every 5 (five) minutes as needed for chest pain.      [name]. omeprazole (PRILOSEC) 20 MG capsule Take 20 mg by mouth daily.      . tamsulosin (FLOMAX) 0.4 MG CAPS capsule Take 0.4 mg by mouth.       No current  facility-administered medications for this visit.    Allergies:    Allergies  Allergen Reactions  . Hornet Venom Anaphylaxis  . Bee Venom   . Codeine   . Isovue [Iopamidol]   . Ivp Dye [Iodinated Diagnostic Agents]     Social History:  The patient  reports that he has never smoked. He does not have any smokeless tobacco history on file.   ROS:  Please see the history of present illness.    All other systems reviewed and negative.   OBJECTIVE: VS:  BP 132/74  Pulse 91  Ht 5\' 5"  (1.651 m)  Wt 159 lb (72.122 kg)  BMI 26.46 kg/m2 Well nourished, well developed, in no acute distress, the patient and her stated age HEENT: normal Neck: JVD flat. Carotid bruit absent  Cardiac:  normal S1, S2; RRR; no murmur Lungs:  clear to auscultation bilaterally, no wheezing, rhonchi or rales Abd: soft, nontender, no hepatomegaly Ext: Edema absent. Pulses 2+ Skin: warm and dry Neuro:  CNs 2-12 intact, no focal abnormalities noted  EKG:  Normal sinus rhythm with PACs. Prominent voltage.       Signed, Darci NeedleHenry W. B. Ariyon Mittleman III, MD 03/16/2014 11:37 AM

## 2014-04-05 ENCOUNTER — Other Ambulatory Visit: Payer: Self-pay | Admitting: *Deleted

## 2014-04-05 ENCOUNTER — Telehealth: Payer: Self-pay | Admitting: *Deleted

## 2014-04-05 ENCOUNTER — Other Ambulatory Visit: Payer: Self-pay

## 2014-04-05 MED ORDER — LISINOPRIL 5 MG PO TABS: 5.0000 mg | ORAL_TABLET | Freq: Every day | ORAL | Status: DC

## 2014-04-05 MED ORDER — METOPROLOL SUCCINATE ER 50 MG PO TB24: 50.0000 mg | ORAL_TABLET | Freq: Every day | ORAL | Status: DC

## 2014-04-05 MED ORDER — ATORVASTATIN CALCIUM 20 MG PO TABS: 20.0000 mg | ORAL_TABLET | Freq: Every day | ORAL | Status: DC

## 2014-04-05 NOTE — Telephone Encounter (Signed)
[facility] requests metoprolol succinate 50mg  refill, but the chart has 25mg . Please advise on which is one the patient is to be taking. Thanks, MI

## 2014-04-05 NOTE — Telephone Encounter (Signed)
Dr Katrinka BlazingSmith increased to 50 MG in July of 2014. Pt should be on 50.

## 2014-04-13 ENCOUNTER — Ambulatory Visit (INDEPENDENT_AMBULATORY_CARE_PROVIDER_SITE_OTHER): Payer: Medicare Other | Admitting: Interventional Cardiology

## 2014-04-13 ENCOUNTER — Other Ambulatory Visit: Payer: Medicare Other

## 2014-04-13 ENCOUNTER — Encounter: Payer: Self-pay | Admitting: Interventional Cardiology

## 2014-04-13 VITALS — BP 145/85 | HR 61 | Ht 65.0 in | Wt 159.0 lb

## 2014-04-13 DIAGNOSIS — R0989 Other specified symptoms and signs involving the circulatory and respiratory systems: Secondary | ICD-10-CM

## 2014-04-13 DIAGNOSIS — I5032 Chronic diastolic (congestive) heart failure: Secondary | ICD-10-CM

## 2014-04-13 DIAGNOSIS — I10 Essential (primary) hypertension: Secondary | ICD-10-CM

## 2014-04-13 DIAGNOSIS — I2510 Atherosclerotic heart disease of native coronary artery without angina pectoris: Secondary | ICD-10-CM

## 2014-04-13 DIAGNOSIS — R0600 Dyspnea, unspecified: Secondary | ICD-10-CM

## 2014-04-13 DIAGNOSIS — R0609 Other forms of dyspnea: Secondary | ICD-10-CM

## 2014-04-13 NOTE — Progress Notes (Signed)
Patient ID: Tyrone Gonzales, male   DOB: 10/28/1940, 73 y.o.   MRN: [id number]    [address] [place], [place]  [address] Phone: [phone] Fax:  [phone]  Date:  04/13/2014   ID:  Tyrone Gonzales, DOB 10/28/1940, MRN [id number]  PCP:  No primary provider on file.   ASSESSMENT:  1. Dyspnea did not improve with diuretic therapy 2. Coronary artery disease with no chest discomfort to suggest angina although I'm starting to become suspicious for dyspnea may be an anginal equivalent 3. Hyperlipidemia 4. History of exercise-induced PSVT  PLAN:  1. Stress Cardiolite study to rule out significant ischemia that could be the explanation for the patient's dyspnea 2. Continue the medical regimen as listed 3. Further workup will be pending the results of the Cardiolite study   SUBJECTIVE: Tyrone Gonzales is a 73 y.o. male who continues to have exertional dyspnea. There has been gradual but definite reduction in exertional tolerance. He has not had palpitations or syncope. He denies orthopnea. 8 follow the low to moderate dose diuretic therapy did not improve exertional dyspnea. He denies claudication.   Wt Readings from Last 3 Encounters:  04/13/14 159 lb (72.122 kg)  03/16/14 159 lb (72.122 kg)     Past Medical History  Diagnosis Date  . Hypertension   . Hyperlipidemia   . Coronary artery disease     BMS Cfx.,1996, RCA BMS 2003  . Arrhythmia     PSVT during stress test    Current Outpatient Prescriptions  Medication Sig Dispense Refill  . aspirin 325 MG EC tablet Take 325 mg by mouth daily.      [name]. atorvastatin (LIPITOR) 20 MG tablet Take 1 tablet (20 mg total) by mouth daily.  30 tablet  6  . lisinopril (PRINIVIL,ZESTRIL) 5 MG tablet Take 1 tablet (5 mg total) by mouth daily.  30 tablet  3  . metoprolol succinate (TOPROL-XL) 50 MG 24 hr tablet Take 1 tablet (50 mg total) by mouth daily. Take with or immediately following a meal.  90 tablet  0  .  nitroGLYCERIN (NITROSTAT) 0.4 MG SL tablet Place 0.4 mg under the tongue every 5 (five) minutes as needed for chest pain.      [name]. omeprazole (PRILOSEC) 20 MG capsule Take 20 mg by mouth daily.      . tamsulosin (FLOMAX) 0.4 MG CAPS capsule Take 0.4 mg by mouth.       No current facility-administered medications for this visit.    Allergies:    Allergies  Allergen Reactions  . Hornet Venom Anaphylaxis  . Bee Venom   . Codeine   . Isovue [Iopamidol]   . Ivp Dye [Iodinated Diagnostic Agents]     Social History:  The patient  reports that he has never smoked. He does not have any smokeless tobacco history on file.   ROS:  Please see the history of present illness.   No orthopnea, PND, cough, wheezing, hemoptysis, weight loss, or decreased appetite   All other systems reviewed and negative.   OBJECTIVE: VS:  BP 145/85  Pulse 61  Ht 5\' 5"  (1.651 m)  Wt 159 lb (72.122 kg)  BMI 26.46 kg/m2 Well nourished, well developed, in no acute distress, appears than his stated age HEENT: normal Neck: JVD flat. Carotid bruit absent  Cardiac:  normal S1, S2; RRR; no murmur Lungs:  clear to auscultation bilaterally, no wheezing, rhonchi or rales Abd: soft, nontender, no hepatomegaly Ext: Edema  absent. Pulses 2+ Skin: warm and dry Neuro:  CNs 2-12 intact, no focal abnormalities noted  EKG:  Not performed       Signed, Darci Needle III, MD 04/13/2014 11:09 AM

## 2014-04-13 NOTE — Patient Instructions (Signed)
Your physician recommends that you continue on your current medications as directed. Please refer to the Current Medication list given to you today.  Your physician has requested that you have a lexiscan myoview. For further information please visit [email or web address]. Please follow instruction sheet, as given.   Your physician wants you to follow-up in: 1 year with Dr.Smith You will receive a reminder letter in the mail two months in advance. If you don't receive a letter, please call our office to schedule the follow-up appointment.  

## 2014-04-20 ENCOUNTER — Ambulatory Visit (HOSPITAL_COMMUNITY): Payer: Medicare Other | Attending: Cardiology | Admitting: Radiology

## 2014-04-20 VITALS — BP 135/85 | Ht 65.0 in | Wt 156.0 lb

## 2014-04-20 DIAGNOSIS — R079 Chest pain, unspecified: Secondary | ICD-10-CM | POA: Insufficient documentation

## 2014-04-20 DIAGNOSIS — R0609 Other forms of dyspnea: Secondary | ICD-10-CM | POA: Diagnosis not present

## 2014-04-20 DIAGNOSIS — I4949 Other premature depolarization: Secondary | ICD-10-CM

## 2014-04-20 DIAGNOSIS — I10 Essential (primary) hypertension: Secondary | ICD-10-CM | POA: Diagnosis present

## 2014-04-20 DIAGNOSIS — R000 Tachycardia, unspecified: Secondary | ICD-10-CM | POA: Insufficient documentation

## 2014-04-20 DIAGNOSIS — R42 Dizziness and giddiness: Secondary | ICD-10-CM | POA: Diagnosis not present

## 2014-04-20 DIAGNOSIS — I479 Paroxysmal tachycardia, unspecified: Secondary | ICD-10-CM

## 2014-04-20 DIAGNOSIS — R002 Palpitations: Secondary | ICD-10-CM | POA: Insufficient documentation

## 2014-04-20 DIAGNOSIS — R0602 Shortness of breath: Secondary | ICD-10-CM

## 2014-04-20 DIAGNOSIS — R0600 Dyspnea, unspecified: Secondary | ICD-10-CM

## 2014-04-20 DIAGNOSIS — R0989 Other specified symptoms and signs involving the circulatory and respiratory systems: Secondary | ICD-10-CM | POA: Insufficient documentation

## 2014-04-20 DIAGNOSIS — I2510 Atherosclerotic heart disease of native coronary artery without angina pectoris: Secondary | ICD-10-CM

## 2014-04-20 MED ORDER — REGADENOSON 0.4 MG/5ML IV SOLN
0.4000 mg | Freq: Once | INTRAVENOUS | Status: AC
  Administered 2014-04-20: 0.4 mg via INTRAVENOUS

## 2014-04-20 MED ORDER — TECHNETIUM TC 99M SESTAMIBI GENERIC - CARDIOLITE
33.0000 | Freq: Once | INTRAVENOUS | Status: AC | PRN
  Administered 2014-04-20: 33 via INTRAVENOUS

## 2014-04-20 MED ORDER — TECHNETIUM TC 99M SESTAMIBI GENERIC - CARDIOLITE
11.0000 | Freq: Once | INTRAVENOUS | Status: AC | PRN
  Administered 2014-04-20: 11 via INTRAVENOUS

## 2014-04-20 NOTE — Progress Notes (Signed)
[facility] SITE 3 NUCLEAR MED [address] [place], [place] [address] [phone]    Cardiology Nuclear Med Study  Tyrone Gonzales is a 73 y.o. male     MRN : [id number]     DOB: 11-29-1940  Procedure Date: 04/20/2014  Nuclear Med Background Indication for Stress Test:  Evaluation for Ischemia and Stent Patency History:  Stent;'12 MPI: test not completed due to tachycardia Cardiac Risk Factors: Hypertension and Lipids  Symptoms:  Dizziness, DOE and Palpitations   Nuclear Pre-Procedure Caffeine/Decaff Intake:  None> 12 hrs NPO After: 5:00pm   Lungs:  clear O2 Sat: 98% on room air. IV 0.9% NS with Angio Cath:  22g  IV Site: R Antecubital x 1, tolerated well IV Started by:  Irean Hong, RN  Chest Size (in):  42 Cup Size: n/a  Height: 5\' 5"  (1.651 m)  Weight:  156 lb (70.761 kg)  BMI:  Body mass index is 25.96 kg/(m^2). Tech Comments:  Patient took Toprol this am. Irean Hong, RN.    Nuclear Med Study 1 or 2 day study: 1 day  Stress Test Type:  Eugenie Birks  Reading MD: N/A  Order Authorizing Provider:  Verdis Prime, MD  Resting Radionuclide: Technetium 40m Sestamibi  Resting Radionuclide Dose: 11.0 mCi   Stress Radionuclide:  Technetium 4m Sestamibi  Stress Radionuclide Dose: 33.0 mCi           Stress Protocol Rest HR: 61 Stress HR: 111  Rest BP: 135/85 Stress BP: 135/86  Exercise Time (min): n/a METS: n/a   Predicted Max HR: 146 bpm % Max HR: 123.29 bpm Rate Pressure Product: 21308   Dose of Adenosine (mg):  n/a Dose of Lexiscan: 0.4 mg  Dose of Atropine (mg): n/a Dose of Dobutamine: n/a mcg/kg/min (at max HR)  Stress Test Technologist: Cathlyn Parsons, RN  Nuclear Technologist:  Doyne Keel, CNMT     Rest Procedure:  Myocardial perfusion imaging was performed at rest 45 minutes following the intravenous administration of Technetium 37m Sestamibi. Rest ECG: NSR - Normal EKG  Stress Procedure:  The patient received IV Lexiscan 0.4 mg over  15-seconds. Patient had chest tightness/pressure 7/10,SOB with infusion;relieved in recovery. Technetium 66m Sestamibi injected at 30-seconds.  Quantitative spect images were obtained after a 45 minute delay. Stress ECG: No significant change from baseline ECG  QPS Raw Data Images:  There is interference from nuclear activity from structures below the diaphragm. This does not affect the ability to read the study. Stress Images:  The images show a small mild - moderately severe area of attenuation of the lateral wall and basal anterior wall.  There is also a small area of moderately severe attenuation of the basal and mid  inferior wall.   Rest Images:  The is a small area of moderate attenuation of the basal and mid inferior wall.   Subtraction (SDS):  There is evidence of reversible ischemi in the basal anterior and lateral  walls with a fixed defect of the inferior wall.    Transient Ischemic Dilatation (Normal <1.22):  0.91 Lung/Heart Ratio (Normal <0.45):  0.34  Quantitative Gated Spect Images QGS EDV:  101 ml QGS ESV:  53 ml  Impression Exercise Capacity:  Lexiscan with no exercise. BP Response:  Normal blood pressure response. Clinical Symptoms:  Mild chest pain/dyspnea. ECG Impression:  No significant ST segment change suggestive of ischemia. Comparison with Prior Nuclear Study: No images to compare  Overall Impression:  Intermediate risk stress nuclear study There  is evidence of ischemia of the anterior and lateral walls with previous infarction of the inferior wall.  .  LV Ejection Fraction: 48%.  LV Wall Motion:  The LV function is mildly depressed.    Vesta MixerPhilip J. Nahser, Montez HagemanJr., MD, [facility] Hospital[facility]ACC 04/20/2014, 4:12 PM [address],  [address] Office [phone] Pager [phone]

## 2014-04-29 ENCOUNTER — Telehealth: Payer: Self-pay

## 2014-04-29 DIAGNOSIS — R9439 Abnormal result of other cardiovascular function study: Secondary | ICD-10-CM

## 2014-04-29 NOTE — Telephone Encounter (Signed)
Message copied by Jarvis Newcomer on Thu Apr 29, 2014  1:00 PM ------      Message from: Verdis Prime      Created: Fri Apr 23, 2014  9:38 PM       Abnormal myocardial perfusion study. Needs Left heart cath and coronary angio ------

## 2014-04-29 NOTE — Telephone Encounter (Signed)
Pre procedure instructions mailed to pt

## 2014-04-29 NOTE — Telephone Encounter (Signed)
pt aware of myoview results and Dr.Smith's recommendations.Abnormal myocardial perfusion study. Needs Left heart cath and coronary angio.pt cath sch for 9/8 @ 7:30am.pt has additional questions for Dr.Smith, adv pt I will fwd Dr.Smith a message to call pt.  Called to give path sch cath date and time, and pre procedure instructions.pt lab appt sch for 9/4.lmtcb

## 2014-05-03 ENCOUNTER — Other Ambulatory Visit: Payer: Self-pay | Admitting: Interventional Cardiology

## 2014-05-03 ENCOUNTER — Encounter (HOSPITAL_COMMUNITY): Payer: Self-pay | Admitting: Pharmacy Technician

## 2014-05-03 DIAGNOSIS — I209 Angina pectoris, unspecified: Secondary | ICD-10-CM

## 2014-05-06 ENCOUNTER — Other Ambulatory Visit: Payer: Self-pay | Admitting: Interventional Cardiology

## 2014-05-06 ENCOUNTER — Other Ambulatory Visit: Payer: Self-pay

## 2014-05-06 ENCOUNTER — Telehealth: Payer: Self-pay | Admitting: Interventional Cardiology

## 2014-05-06 DIAGNOSIS — Z91041 Radiographic dye allergy status: Secondary | ICD-10-CM

## 2014-05-06 DIAGNOSIS — I25119 Atherosclerotic heart disease of native coronary artery with unspecified angina pectoris: Secondary | ICD-10-CM

## 2014-05-06 MED ORDER — PREDNISONE 20 MG PO TABS: ORAL_TABLET | ORAL | Status: DC

## 2014-05-06 NOTE — Telephone Encounter (Signed)
Discussed indication for coronary angiography. Discussed the risks including stroke, death, myocardial infarction, kidney failure, allergy. Discussed the need for contrast prophylaxis. Orders for prednisone will be called in and instructions given by Misty Stanley. He would need prednisone 60 mg at 2 PM on the day prior to cath and 6 AM on the day of cath. He should send him six 20 mg tablets, and instruct him to take 3 tablets on each dose.

## 2014-05-06 NOTE — Telephone Encounter (Signed)
PT AWAREOF PRE PROCEDURE CATH PREDNISONE PROTOCOL.  @ 2PM THE DAY BEFORE PROCEDURE AND TAKE  6AM THE MORNING OF PROCEDURE. PT VERBALIZED UNDERSTANDING.

## 2014-05-06 NOTE — Telephone Encounter (Signed)
PT AWAREOF PRE PROCEDURE CATH PREDNISONE PROTOCOL.  @ 2PM THE DAY BEFORE PROCEDURE AND TAKE  6AM THE MORNING OF PROCEDURE. RX CALLED IN TO PT [facility] .PT VERBALIZED UNDERSTANDING.

## 2014-05-07 ENCOUNTER — Other Ambulatory Visit (INDEPENDENT_AMBULATORY_CARE_PROVIDER_SITE_OTHER): Payer: Medicare Other

## 2014-05-07 ENCOUNTER — Telehealth: Payer: Self-pay

## 2014-05-07 ENCOUNTER — Ambulatory Visit
Admission: RE | Admit: 2014-05-07 | Discharge: 2014-05-07 | Disposition: A | Discharge status: Home / Self Care | Payer: Medicare Other | Source: Ambulatory Visit | Attending: Interventional Cardiology | Admitting: Interventional Cardiology

## 2014-05-07 DIAGNOSIS — R9439 Abnormal result of other cardiovascular function study: Secondary | ICD-10-CM

## 2014-05-07 DIAGNOSIS — R0989 Other specified symptoms and signs involving the circulatory and respiratory systems: Secondary | ICD-10-CM

## 2014-05-07 DIAGNOSIS — R0600 Dyspnea, unspecified: Secondary | ICD-10-CM

## 2014-05-07 DIAGNOSIS — R0609 Other forms of dyspnea: Secondary | ICD-10-CM

## 2014-05-07 LAB — CBC WITH DIFFERENTIAL/PLATELET
BASOS ABS: 0.1 10*3/uL (ref 0.0–0.1)
Basophils Relative: 1.1 % (ref 0.0–3.0)
Eosinophils Absolute: 0.1 10*3/uL (ref 0.0–0.7)
Eosinophils Relative: 1.6 % (ref 0.0–5.0)
HCT: 41.2 % (ref 39.0–52.0)
Hemoglobin: 14 g/dL (ref 13.0–17.0)
LYMPHS PCT: 21.1 % (ref 12.0–46.0)
Lymphs Abs: 1.4 10*3/uL (ref 0.7–4.0)
MCHC: 34 g/dL (ref 30.0–36.0)
MCV: 89.6 fl (ref 78.0–100.0)
Monocytes Absolute: 0.6 10*3/uL (ref 0.1–1.0)
Monocytes Relative: 9.3 % (ref 3.0–12.0)
NEUTROS PCT: 66.9 % (ref 43.0–77.0)
Neutro Abs: 4.4 10*3/uL (ref 1.4–7.7)
Platelets: 177 10*3/uL (ref 150.0–400.0)
RBC: 4.6 Mil/uL (ref 4.22–5.81)
RDW: 14 % (ref 11.5–15.5)
WBC: 6.6 10*3/uL (ref 4.0–10.5)

## 2014-05-07 LAB — BASIC METABOLIC PANEL
BUN: 17 mg/dL (ref 6–23)
CALCIUM: 9.2 mg/dL (ref 8.4–10.5)
CO2: 22 mEq/L (ref 19–32)
Chloride: 107 mEq/L (ref 96–112)
Creatinine, Ser: 1.2 mg/dL (ref 0.4–1.5)
GFR: 61.58 mL/min (ref >60.00)
Glucose, Bld: 107 mg/dL — ABNORMAL HIGH (ref 70–99)
POTASSIUM: 3.9 meq/L (ref 3.5–5.1)
Sodium: 140 mEq/L (ref 135–145)

## 2014-05-07 LAB — PROTIME-INR
INR: 1.1 ratio — ABNORMAL HIGH (ref 0.8–1.0)
PROTHROMBIN TIME: 12.7 s (ref 9.6–13.1)

## 2014-05-07 NOTE — Telephone Encounter (Signed)
pt aware of  lab results.Labs are okay,  and prelim cxr results.pt verbalized understanding.

## 2014-05-07 NOTE — Telephone Encounter (Signed)
Message copied by Jarvis Newcomer on Fri May 07, 2014  3:02 PM ------      Message from: Verdis Prime      Created: Fri May 07, 2014  2:41 PM       Labs are okay ------

## 2014-05-11 ENCOUNTER — Ambulatory Visit (HOSPITAL_COMMUNITY)
Admission: RE | Admit: 2014-05-11 | Discharge: 2014-05-11 | Disposition: A | Discharge status: Home / Self Care | Payer: Medicare Other | Source: Ambulatory Visit | Attending: Interventional Cardiology | Admitting: Interventional Cardiology

## 2014-05-11 ENCOUNTER — Encounter (HOSPITAL_COMMUNITY)
Admission: RE | Disposition: A | Discharge status: Home / Self Care | Payer: Self-pay | Source: Ambulatory Visit | Attending: Interventional Cardiology

## 2014-05-11 DIAGNOSIS — E785 Hyperlipidemia, unspecified: Secondary | ICD-10-CM | POA: Insufficient documentation

## 2014-05-11 DIAGNOSIS — R0989 Other specified symptoms and signs involving the circulatory and respiratory systems: Secondary | ICD-10-CM | POA: Insufficient documentation

## 2014-05-11 DIAGNOSIS — I209 Angina pectoris, unspecified: Secondary | ICD-10-CM

## 2014-05-11 DIAGNOSIS — I471 Supraventricular tachycardia, unspecified: Secondary | ICD-10-CM | POA: Insufficient documentation

## 2014-05-11 DIAGNOSIS — I10 Essential (primary) hypertension: Secondary | ICD-10-CM | POA: Insufficient documentation

## 2014-05-11 DIAGNOSIS — R0609 Other forms of dyspnea: Secondary | ICD-10-CM | POA: Diagnosis not present

## 2014-05-11 DIAGNOSIS — Z91041 Radiographic dye allergy status: Secondary | ICD-10-CM

## 2014-05-11 DIAGNOSIS — Z7982 Long term (current) use of aspirin: Secondary | ICD-10-CM | POA: Diagnosis not present

## 2014-05-11 DIAGNOSIS — I2510 Atherosclerotic heart disease of native coronary artery without angina pectoris: Secondary | ICD-10-CM | POA: Insufficient documentation

## 2014-05-11 HISTORY — PX: LEFT HEART CATHETERIZATION WITH CORONARY ANGIOGRAM: SHX5451

## 2014-05-11 SURGERY — LEFT HEART CATHETERIZATION WITH CORONARY ANGIOGRAM
Anesthesia: LOCAL

## 2014-05-11 MED ORDER — FAMOTIDINE IN NACL 20-0.9 MG/50ML-% IV SOLN
20.0000 mg | INTRAVENOUS | Status: AC
  Administered 2014-05-11: 20 mg via INTRAVENOUS

## 2014-05-11 MED ORDER — ONDANSETRON HCL 4 MG/2ML IJ SOLN: 4.0000 mg | Freq: Four times a day (QID) | INTRAMUSCULAR | Status: DC | PRN

## 2014-05-11 MED ORDER — SODIUM CHLORIDE 0.9 % IJ SOLN: 3.0000 mL | INTRAMUSCULAR | Status: DC | PRN

## 2014-05-11 MED ORDER — HEPARIN SODIUM (PORCINE) 1000 UNIT/ML IJ SOLN
INTRAMUSCULAR | Status: AC
  Filled 2014-05-11: qty 1

## 2014-05-11 MED ORDER — SODIUM CHLORIDE 0.9 % IV SOLN
INTRAVENOUS | Status: DC
  Administered 2014-05-11: 07:00:00 via INTRAVENOUS

## 2014-05-11 MED ORDER — ASPIRIN 81 MG PO CHEW
CHEWABLE_TABLET | ORAL | Status: AC
  Filled 2014-05-11: qty 1

## 2014-05-11 MED ORDER — SODIUM CHLORIDE 0.9 % IV SOLN: INTRAVENOUS | Status: AC

## 2014-05-11 MED ORDER — HEPARIN (PORCINE) IN NACL 2-0.9 UNIT/ML-% IJ SOLN
INTRAMUSCULAR | Status: AC
  Filled 2014-05-11: qty 1000

## 2014-05-11 MED ORDER — LIDOCAINE HCL (PF) 1 % IJ SOLN
INTRAMUSCULAR | Status: AC
  Filled 2014-05-11: qty 30

## 2014-05-11 MED ORDER — FAMOTIDINE IN NACL 20-0.9 MG/50ML-% IV SOLN
INTRAVENOUS | Status: AC
  Filled 2014-05-11: qty 50

## 2014-05-11 MED ORDER — SODIUM CHLORIDE 0.9 % IV SOLN: 250.0000 mL | INTRAVENOUS | Status: DC | PRN

## 2014-05-11 MED ORDER — SODIUM CHLORIDE 0.9 % IJ SOLN: 3.0000 mL | Freq: Two times a day (BID) | INTRAMUSCULAR | Status: DC

## 2014-05-11 MED ORDER — MIDAZOLAM HCL 2 MG/2ML IJ SOLN
INTRAMUSCULAR | Status: AC
  Filled 2014-05-11: qty 2

## 2014-05-11 MED ORDER — DIPHENHYDRAMINE HCL 50 MG/ML IJ SOLN
INTRAMUSCULAR | Status: AC
  Filled 2014-05-11: qty 1

## 2014-05-11 MED ORDER — METHYLPREDNISOLONE SODIUM SUCC 125 MG IJ SOLR: 125.0000 mg | INTRAMUSCULAR | Status: DC

## 2014-05-11 MED ORDER — METHYLPREDNISOLONE SODIUM SUCC 125 MG IJ SOLR
INTRAMUSCULAR | Status: AC
  Filled 2014-05-11: qty 2

## 2014-05-11 MED ORDER — FENTANYL CITRATE 0.05 MG/ML IJ SOLN
INTRAMUSCULAR | Status: AC
  Filled 2014-05-11: qty 2

## 2014-05-11 MED ORDER — ACETAMINOPHEN 325 MG PO TABS: 650.0000 mg | ORAL_TABLET | ORAL | Status: DC | PRN

## 2014-05-11 MED ORDER — NITROGLYCERIN 1 MG/10 ML FOR IR/CATH LAB
INTRA_ARTERIAL | Status: AC
  Filled 2014-05-11: qty 10

## 2014-05-11 MED ORDER — VERAPAMIL HCL 2.5 MG/ML IV SOLN
INTRAVENOUS | Status: AC
  Filled 2014-05-11: qty 2

## 2014-05-11 MED ORDER — DIPHENHYDRAMINE HCL 50 MG/ML IJ SOLN
25.0000 mg | INTRAMUSCULAR | Status: AC
  Administered 2014-05-11: 25 mg via INTRAVENOUS

## 2014-05-11 MED ORDER — ASPIRIN 81 MG PO CHEW
81.0000 mg | CHEWABLE_TABLET | ORAL | Status: AC
  Administered 2014-05-11: 81 mg via ORAL

## 2014-05-11 NOTE — H&P (View-Only) (Signed)
Patient ID: Tyrone Gonzales, male   DOB: 1940/12/22, 73 y.o.   MRN: [id number]    [address] [place], [place]  [address] Phone: [phone] Fax:  [phone]  Date:  04/13/2014   ID:  Tyrone Gonzales, DOB [birth year], MRN [id number]  PCP:  No primary provider on file.   ASSESSMENT:  1. Dyspnea did not improve with diuretic therapy 2. Coronary artery disease with no chest discomfort to suggest angina although I'm starting to become suspicious for dyspnea may be an anginal equivalent 3. Hyperlipidemia 4. History of exercise-induced PSVT  PLAN:  1. Stress Cardiolite study to rule out significant ischemia that could be the explanation for the patient's dyspnea 2. Continue the medical regimen as listed 3. Further workup will be pending the results of the Cardiolite study   SUBJECTIVE: Tyrone Gonzales is a 73 y.o. male who continues to have exertional dyspnea. There has been gradual but definite reduction in exertional tolerance. He has not had palpitations or syncope. He denies orthopnea. 8 follow the low to moderate dose diuretic therapy did not improve exertional dyspnea. He denies claudication.   Wt Readings from Last 3 Encounters:  04/13/14 159 lb (72.122 kg)  03/16/14 159 lb (72.122 kg)     Past Medical History  Diagnosis Date  . Hypertension   . Hyperlipidemia   . Coronary artery disease     BMS Cfx.,1996, RCA BMS 2003  . Arrhythmia     PSVT during stress test    Current Outpatient Prescriptions  Medication Sig Dispense Refill  . aspirin 325 MG EC tablet Take 325 mg by mouth daily.      [name] atorvastatin (LIPITOR) 20 MG tablet Take 1 tablet (20 mg total) by mouth daily.  30 tablet  6  . lisinopril (PRINIVIL,ZESTRIL) 5 MG tablet Take 1 tablet (5 mg total) by mouth daily.  30 tablet  3  . metoprolol succinate (TOPROL-XL) 50 MG 24 hr tablet Take 1 tablet (50 mg total) by mouth daily. Take with or immediately following a meal.  90 tablet  0  .  nitroGLYCERIN (NITROSTAT) 0.4 MG SL tablet Place 0.4 mg under the tongue every 5 (five) minutes as needed for chest pain.      [name] omeprazole (PRILOSEC) 20 MG capsule Take 20 mg by mouth daily.      . tamsulosin (FLOMAX) 0.4 MG CAPS capsule Take 0.4 mg by mouth.       No current facility-administered medications for this visit.    Allergies:    Allergies  Allergen Reactions  . Hornet Venom Anaphylaxis  . Bee Venom   . Codeine   . Isovue [Iopamidol]   . Ivp Dye [Iodinated Diagnostic Agents]     Social History:  The patient  reports that he has never smoked. He does not have any smokeless tobacco history on file.   ROS:  Please see the history of present illness.   No orthopnea, PND, cough, wheezing, hemoptysis, weight loss, or decreased appetite   All other systems reviewed and negative.   OBJECTIVE: VS:  BP 145/85  Pulse 61  Ht  (1.651 m)  Wt 159 lb (72.122 kg)  BMI 26.46 kg/m2 Well nourished, well developed, in no acute distress, appears than his stated age HEENT: normal Neck: JVD flat. Carotid bruit absent  Cardiac:  normal S1, S2; RRR; no murmur Lungs:  clear to auscultation bilaterally, no wheezing, rhonchi or rales Abd: soft, nontender, no hepatomegaly Ext: Edema  absent. Pulses 2+ Skin: warm and dry Neuro:  CNs 2-12 intact, no focal abnormalities noted  EKG:  Not performed       Signed, Darci Needle III, MD 04/13/2014 11:09 AM

## 2014-05-11 NOTE — Discharge Instructions (Signed)
Radial Site Care °Refer to this sheet in the next few weeks. These instructions provide you with information on caring for yourself after your procedure. Your caregiver may also give you more specific instructions. Your treatment has been planned according to current medical practices, but problems sometimes occur. Call your caregiver if you have any problems or questions after your procedure. °HOME CARE INSTRUCTIONS °· You may shower the day after the procedure. Remove the bandage (dressing) and gently wash the site with plain soap and water. Gently pat the site dry. °· Do not apply powder or lotion to the site. °· Do not submerge the affected site in water for 3 to 5 days. °· Inspect the site at least twice daily. °· Do not flex or bend the affected arm for 24 hours. °· No lifting over 5 pounds (2.3 kg) for 5 days after your procedure. °· Do not drive home if you are discharged the same day of the procedure. Have someone else drive you. °· You may drive 24 hours after the procedure unless otherwise instructed by your caregiver. °· Do not operate machinery or power tools for 24 hours. °· A responsible adult should be with you for the first 24 hours after you arrive home. °What to expect: °· Any bruising will usually fade within 1 to 2 weeks. °· Blood that collects in the tissue (hematoma) may be painful to the touch. It should usually decrease in size and tenderness within 1 to 2 weeks. °SEEK IMMEDIATE MEDICAL CARE IF: °· You have unusual pain at the radial site. °· You have redness, warmth, swelling, or pain at the radial site. °· You have drainage (other than a small amount of blood on the dressing). °· You have chills. °· You have a fever or persistent symptoms for more than 72 hours. °· You have a fever and your symptoms suddenly get worse. °· Your arm becomes pale, cool, tingly, or numb. °· You have heavy bleeding from the site. Hold pressure on the site. °Document Released: 09/22/2010 Document Revised:  11/12/2011 Document Reviewed: 09/22/2010 °ExitCare® Patient Information ©2015 [employer]. This information is not intended to replace advice given to you by your health care provider. Make sure you discuss any questions you have with your health care provider. ° °

## 2014-05-11 NOTE — Interval H&P Note (Signed)
Cath Lab Visit (complete for each Cath Lab visit)  Clinical Evaluation Leading to the Procedure:   ACS: No.  Non-ACS:    Anginal Classification: CCS IV  Anti-ischemic medical therapy: Maximal Therapy (2 or more classes of medications)  Non-Invasive Test Results: High-risk stress test findings: cardiac mortality >3%/year  Prior CABG: No previous CABG      History and Physical Interval Note:  05/11/2014 11:44 AM  Tyrone Gonzales  has presented today for surgery, with the diagnosis of abnormal nuc  The various methods of treatment have been discussed with the patient and family. After consideration of risks, benefits and other options for treatment, the patient has consented to  Procedure(s): LEFT HEART CATHETERIZATION WITH CORONARY ANGIOGRAM (N/A) as a surgical intervention .  The patient's history has been reviewed, patient examined, no change in status, stable for surgery.  I have reviewed the patient's chart and labs.  Questions were answered to the patient's satisfaction.     Tyrone Gonzales

## 2014-05-11 NOTE — Interval H&P Note (Signed)
Cath Lab Visit (complete for each Cath Lab visit)  Clinical Evaluation Leading to the Procedure:   ACS: No.  Non-ACS:    Anginal Classification: CCS III  Anti-ischemic medical therapy: Maximal Therapy (2 or more classes of medications)  Non-Invasive Test Results: Low-risk stress test findings: cardiac mortality <1%/year  Prior CABG: No previous CABG      History and Physical Interval Note:  05/11/2014 8:16 AM  Tyrone Gonzales  has presented today for surgery, with the diagnosis of abnormal nuc  The various methods of treatment have been discussed with the patient and family. After consideration of risks, benefits and other options for treatment, the patient has consented to  Procedure(s): LEFT HEART CATHETERIZATION WITH CORONARY ANGIOGRAM (N/A) as a surgical intervention .  The patient's history has been reviewed, patient examined, no change in status, stable for surgery.  I have reviewed the patient's chart and labs.  Questions were answered to the patient's satisfaction.     [name]

## 2014-05-11 NOTE — CV Procedure (Signed)
    Left Heart Catheterization with Coronary Angiography  Report  Tyrone Gonzales  73 y.o.  male 04-Jan-1941  Procedure Date: 05/11/2014 Referring Physician: Manning/Beam/JaVID Primary Cardiologist:  Denice Bors, M.D.  INDICATIONS: Unexplained dyspnea in a patient with multiple prior stents involving 2 coronary arteries. Recent low risk myocardial perfusion study. Under the circumstances, with attempting to rule out a mechanical and and the possibility that the perfusion study was a representation of balanced ischemia  PROCEDURE: 1. Left heart catheterization; 2. Coronary angiography; 3. Left ventriculography  CONSENT:  The risks, benefits, and details of the procedure were explained in detail to the patient. Risks including death, stroke, heart attack, kidney injury, allergy, limb ischemia, bleeding and radiation injury were discussed.  The patient verbalized understanding and wanted to proceed.  Informed written consent was obtained.  PROCEDURE TECHNIQUE:  After Xylocaine anesthesia a 5 French Slender sheath was placed in the right radial artery with an angiocath and the modified Seldinger technique.  Coronary angiography was done using a 5 F JR 4 and JL 3.5 cm diagnostic catheters.  Left ventriculography was done using the JR 4 catheter and hand injection.   Multiple orthogonal views of each coronary was obtained. Digital images were reviewed. The case was terminated.  Hemostasis was achieved with a wrist band at 11 cc.   CONTRAST:  Total of 80 cc.  COMPLICATIONS:  None   HEMODYNAMICS:  Aortic pressure 139/80 mmHg; LV pressure 140/80; LVEDP  12 mmHg  ANGIOGRAPHIC DATA:   The left main coronary artery is widely patent.  The left anterior descending artery is widely patent with a mid vessel contained a segmental tandem stenoses up to 50%. The proximal vessel before the origin of the diagonal contains 30-40% narrowing.  The left circumflex artery is widely patent and gives origin to  3 obtuse marginal branches. The first is small the second contains segmental proximal to mid 50-70% narrowing with very little myocardium supplied beyond the segmental disease. The third marginal is widely patent. The distal circumflex before the second obtuse marginal contains eccentric 30% narrowing. The mid vessel stent is widely patent.  The right coronary artery is widely patent. The proximal and distal stents are widely patent. Minimal luminal irregularities are noted in the proximal, mid, and distal vessel. Multiple proximal to mid PDA stenoses less than 60% noted. No hemodynamically significant stenosis is noted .   LEFT VENTRICULOGRAM:  Left ventricular angiogram was done in the 30 RAO projection and revealed normal size and overall contractility. EF is 60%.   IMPRESSIONS:  1. Moderate distal RCA and PDA disease, moderately severe obtuse marginal #2 with segmental disease less than 70%. Circumflex and RCA stents are widely patent. 2. No hemodynamically significant stenoses were identified. 3. Normal left ventricular function 4. Left ventricular hemodynamics are normal without evidence of diastolic heart failure   RECOMMENDATION:  Dyspnea is not related to decreased LV function, diastolic heart failure, or myocardial ischemia. No further cardiac evaluation to explain the dyspnea is indicated.[name]

## 2014-07-26 ENCOUNTER — Other Ambulatory Visit: Payer: Self-pay | Admitting: Interventional Cardiology

## 2014-08-12 ENCOUNTER — Encounter (HOSPITAL_COMMUNITY): Payer: Self-pay | Admitting: Interventional Cardiology

## 2014-09-22 ENCOUNTER — Other Ambulatory Visit (INDEPENDENT_AMBULATORY_CARE_PROVIDER_SITE_OTHER): Payer: Medicare Other | Admitting: *Deleted

## 2014-09-22 DIAGNOSIS — R9439 Abnormal result of other cardiovascular function study: Secondary | ICD-10-CM

## 2014-09-22 DIAGNOSIS — R931 Abnormal findings on diagnostic imaging of heart and coronary circulation: Secondary | ICD-10-CM

## 2014-09-22 DIAGNOSIS — E785 Hyperlipidemia, unspecified: Secondary | ICD-10-CM

## 2014-09-22 LAB — BASIC METABOLIC PANEL
BUN: 20 mg/dL (ref 6–23)
CO2: 29 meq/L (ref 19–32)
CREATININE: 1.05 mg/dL (ref 0.40–1.50)
Calcium: 9 mg/dL (ref 8.4–10.5)
Chloride: 104 mEq/L (ref 96–112)
GFR: 73.15 mL/min (ref >60.00)
Glucose, Bld: 106 mg/dL — ABNORMAL HIGH (ref 70–99)
Potassium: 4.1 mEq/L (ref 3.5–5.1)
Sodium: 138 mEq/L (ref 135–145)

## 2014-09-22 LAB — LIPID PANEL
CHOL/HDL RATIO: 6
Cholesterol: 199 mg/dL (ref 0–200)
HDL: 33.9 mg/dL — ABNORMAL LOW (ref >39.00)
NonHDL: 165.1
Triglycerides: 296 mg/dL — ABNORMAL HIGH (ref 0.0–149.0)
VLDL: 59.2 mg/dL — ABNORMAL HIGH (ref 0.0–40.0)

## 2014-09-22 LAB — ALT: ALT: 27 U/L (ref 0–53)

## 2014-09-22 LAB — LDL CHOLESTEROL, DIRECT: LDL DIRECT: 116 mg/dL

## 2014-09-22 NOTE — Addendum Note (Signed)
Addended by: Tonita PhoenixBOWDEN, ROBIN K on: 09/22/2014 07:51 AM   Modules accepted: Orders

## 2014-09-23 ENCOUNTER — Other Ambulatory Visit: Payer: Medicare Other

## 2014-09-28 ENCOUNTER — Ambulatory Visit (INDEPENDENT_AMBULATORY_CARE_PROVIDER_SITE_OTHER): Payer: Medicare Other | Admitting: Interventional Cardiology

## 2014-09-28 ENCOUNTER — Encounter: Payer: Self-pay | Admitting: Interventional Cardiology

## 2014-09-28 VITALS — BP 138/80 | HR 70 | Ht 65.0 in | Wt 159.4 lb

## 2014-09-28 DIAGNOSIS — E785 Hyperlipidemia, unspecified: Secondary | ICD-10-CM

## 2014-09-28 DIAGNOSIS — Z91041 Radiographic dye allergy status: Secondary | ICD-10-CM

## 2014-09-28 DIAGNOSIS — I10 Essential (primary) hypertension: Secondary | ICD-10-CM

## 2014-09-28 DIAGNOSIS — I2510 Atherosclerotic heart disease of native coronary artery without angina pectoris: Secondary | ICD-10-CM

## 2014-09-28 MED ORDER — LISINOPRIL 10 MG PO TABS: 10.0000 mg | ORAL_TABLET | Freq: Every day | ORAL | Status: DC

## 2014-09-28 NOTE — Patient Instructions (Signed)
Your physician has recommended you make the following change in your medication:  1) STOP Metotprolol 2) INCREASE Lisinopril to 10 mg daily. An Rx has been sent to your pharmacy  Measure your BP 2-3 times between Now and Mid February. Call the office if readings are consistently 140/90.  Your physician wants you to follow-up in: 6 months with Dr.Smith You will receive a reminder letter in the mail two months in advance. If you don't receive a letter, please call our office to schedule the follow-up appointment.

## 2014-09-28 NOTE — Progress Notes (Signed)
Patient ID: Tyrone Gonzales, male   DOB: 1940-10-17, 74 y.o.   MRN: [id number]    [address] [place], [place]  [id number] Phone: [phone] Fax:  [phone]  Date:  09/28/2014   ID:  Tyrone Gonzales, DOB 1940-10-17, MRN [id number]  PCP:  No primary care provider on file.   ASSESSMENT:  1. Essential hypertension, controlled 2. Coronary artery disease with recent catheterization demonstrating moderate obtuse marginal disease but no significant obstruction 3. Dyspnea of uncertain cause 4. Hyperlipidemia  PLAN:  1. Discontinue metoprolol 2. Increase lisinopril to 10 mg per day 3. Outpatient blood pressure measurement by patient. He should call us if consistent levels above 140/90 mmHg and noted 4. Clinical follow-up in 6 months 5. I encouraged aerobic activity to build endurance   SUBJECTIVE: Tyrone Gonzales is a 74 y.o. male is doing relatively well but still complains of dyspnea. He underwent coronary angiography recently and we did not find significant obstructive disease. RCA and circumflex stents were widely patent. There was less than 70% segmental obtuse marginal obstruction. Normal left ventricular end-diastolic pressures were noted. Dyspnea is probably functional. He stopped his statin therapy thinking that this would improve the dyspnea. He is clearly still being troubled by this. Pulmonologist not been able to find a solution.   Wt Readings from Last 3 Encounters:  09/28/14 159 lb 6.4 oz (72.303 kg)  05/11/14 155 lb (70.308 kg)  04/20/14 156 lb (70.761 kg)     Past Medical History  Diagnosis Date  . Hypertension   . Hyperlipidemia   . Coronary artery disease     BMS Cfx.,1996, RCA BMS 2003  . Arrhythmia     PSVT during stress test    Current Outpatient Prescriptions  Medication Sig Dispense Refill  . aspirin 325 MG EC tablet Take 325 mg by mouth daily.    [name]. atorvastatin (LIPITOR) 20 MG tablet Take 20 mg by mouth daily.    [name]. azelastine  (ASTELIN) 0.1 % nasal spray Place 0.1 sprays into both nostrils 2 (two) times daily.  5  . azelastine (OPTIVAR) 0.05 % ophthalmic solution Place 0.05 drops into both eyes 2 (two) times daily.  5  . lisinopril (PRINIVIL,ZESTRIL) 5 MG tablet TAKE 1 TABLET (5 MG TOTAL) BY MOUTH DAILY. 30 tablet 9  . metoprolol succinate (TOPROL-XL) 50 MG 24 hr tablet Take 25 mg by mouth daily. Take with or immediately following a meal.    . nitroGLYCERIN (NITROSTAT) 0.4 MG SL tablet Place 0.4 mg under the tongue every 5 (five) minutes as needed for chest pain.    [name]. omeprazole (PRILOSEC) 20 MG capsule Take 20 mg by mouth daily.    . tamsulosin (FLOMAX) 0.4 MG CAPS capsule Take 0.4 mg by mouth daily.      No current facility-administered medications for this visit.    Allergies:    Allergies  Allergen Reactions  . Bee Venom Anaphylaxis  . Hornet Venom Anaphylaxis  . Codeine Nausea Only  . Isovue [Iopamidol] Rash  . Ivp Dye [Iodinated Diagnostic Agents] Rash    Social History:  The patient  reports that he has never smoked. He does not have any smokeless tobacco history on file.   ROS:  Please see the history of present illness.   No orthopnea, PND, or wheezing   All other systems reviewed and negative.   OBJECTIVE: VS:  BP 138/80 mmHg  Pulse 70  Ht 5\' 5"  (1.651 m)  Wt 159 lb  6.4 oz (72.303 kg)  BMI 26.53 kg/m2  SpO2 96% Well nourished, well developed, in no acute distress, younger than stated age HEENT: normal Neck: JVD flat. Carotid bruit absent  Cardiac:  normal S1, S2; RRR; no murmur Lungs:  clear to auscultation bilaterally, no wheezing, rhonchi or rales Abd: soft, nontender, no hepatomegaly Ext: Edema none. Pulses 2+ Skin: warm and dry Neuro:  CNs 2-12 intact, no focal abnormalities noted  EKG:  Not performed       Signed, Darci Needle III, MD 09/28/2014 12:22 PM

## 2014-11-15 ENCOUNTER — Other Ambulatory Visit: Payer: Self-pay

## 2014-11-15 MED ORDER — ATORVASTATIN CALCIUM 20 MG PO TABS: 20.0000 mg | ORAL_TABLET | Freq: Every day | ORAL | Status: DC

## 2015-01-04 ENCOUNTER — Telehealth: Payer: Self-pay | Admitting: Interventional Cardiology

## 2015-01-04 DIAGNOSIS — E785 Hyperlipidemia, unspecified: Secondary | ICD-10-CM

## 2015-01-04 NOTE — Telephone Encounter (Signed)
He should have a fasting liver and lipid panel performed prior to the office visit. I don't believe an echocardiogram would help us in any way to explain shortness of breath.

## 2015-01-04 NOTE — Telephone Encounter (Signed)
New Message  Pt wanted to speak w/ Rn to see if he should have lab work before 8/2 appt. Please call back and discuss.

## 2015-01-04 NOTE — Telephone Encounter (Signed)
Returned pt call. Pt sts that in Jan 2016 when he had his lipid labs he had not been taking his Atorvastatin. His lipid numbers were elevated and he restarted after getting his results. He would like to have his lipids checked prior to seeing Dr.Smith in Aug. He has been having an onging issue with dyspnea. Dr.Smith d/c Metoprolol, pt had a myoview and heart cath to determine the cause. His cath was ok. He has not had any improvement in his dyspnea after d/c Metoprolol. He has had a pulmonary work up with his pulmonologist who asked him if he has ever had en echo.pt sts that he has not. He would like to know if Dr.Smith would recommend him having an echo prior to his scheduled f/u in Aug. Also when should he have his lipids repeated.  He has been periodically checking his BP and it always hover around 140/90 Adv pt I will fwd Dr.Smith a message and call back with his recommendation. Pt agreeable and verbalized understanding.

## 2015-01-07 NOTE — Telephone Encounter (Signed)
Called to give pt Dr.Smith's response.lmtcb 

## 2015-01-10 NOTE — Telephone Encounter (Signed)
attempted to return pt call. pt phone line busy

## 2015-01-10 NOTE — Telephone Encounter (Signed)
New Message ° ° °Patient is returning nurses call. Please call back. °Thanks  °

## 2015-01-10 NOTE — Telephone Encounter (Signed)
2nd attempt to call pt. Pt phone line busy

## 2015-01-11 NOTE — Telephone Encounter (Signed)
01-11-15 pt rtn call--set up lab's , need lab order-- made him aware echo not needed at this time--

## 2015-02-21 IMAGING — CR DG CHEST 2V
2 series · 2 of 2 positions shown · non-contrast
Comparison: July 16, 2009.

CLINICAL DATA: Shortness of breath.

EXAM:
CHEST  2 VIEW

[w chest pa]
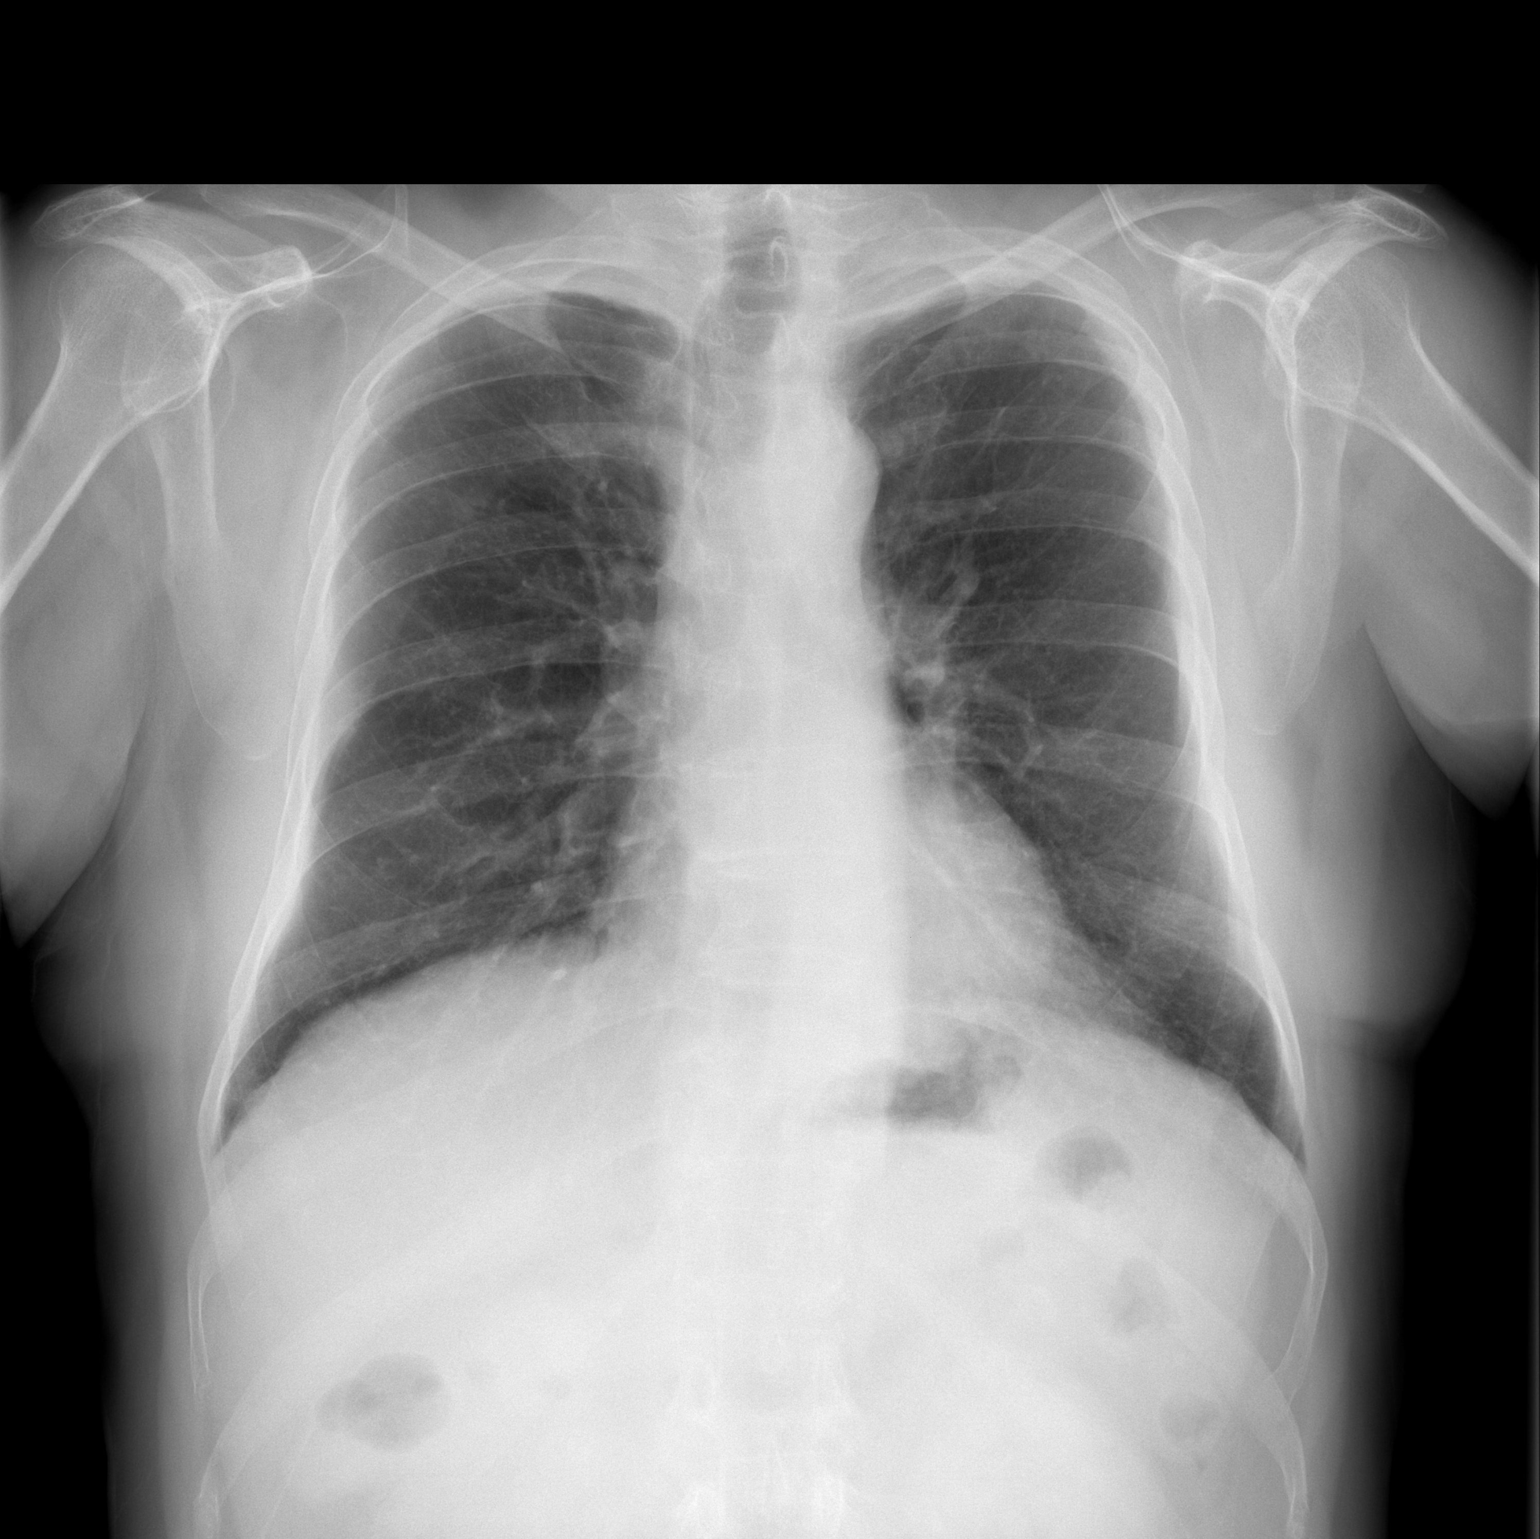

[w chest lat]
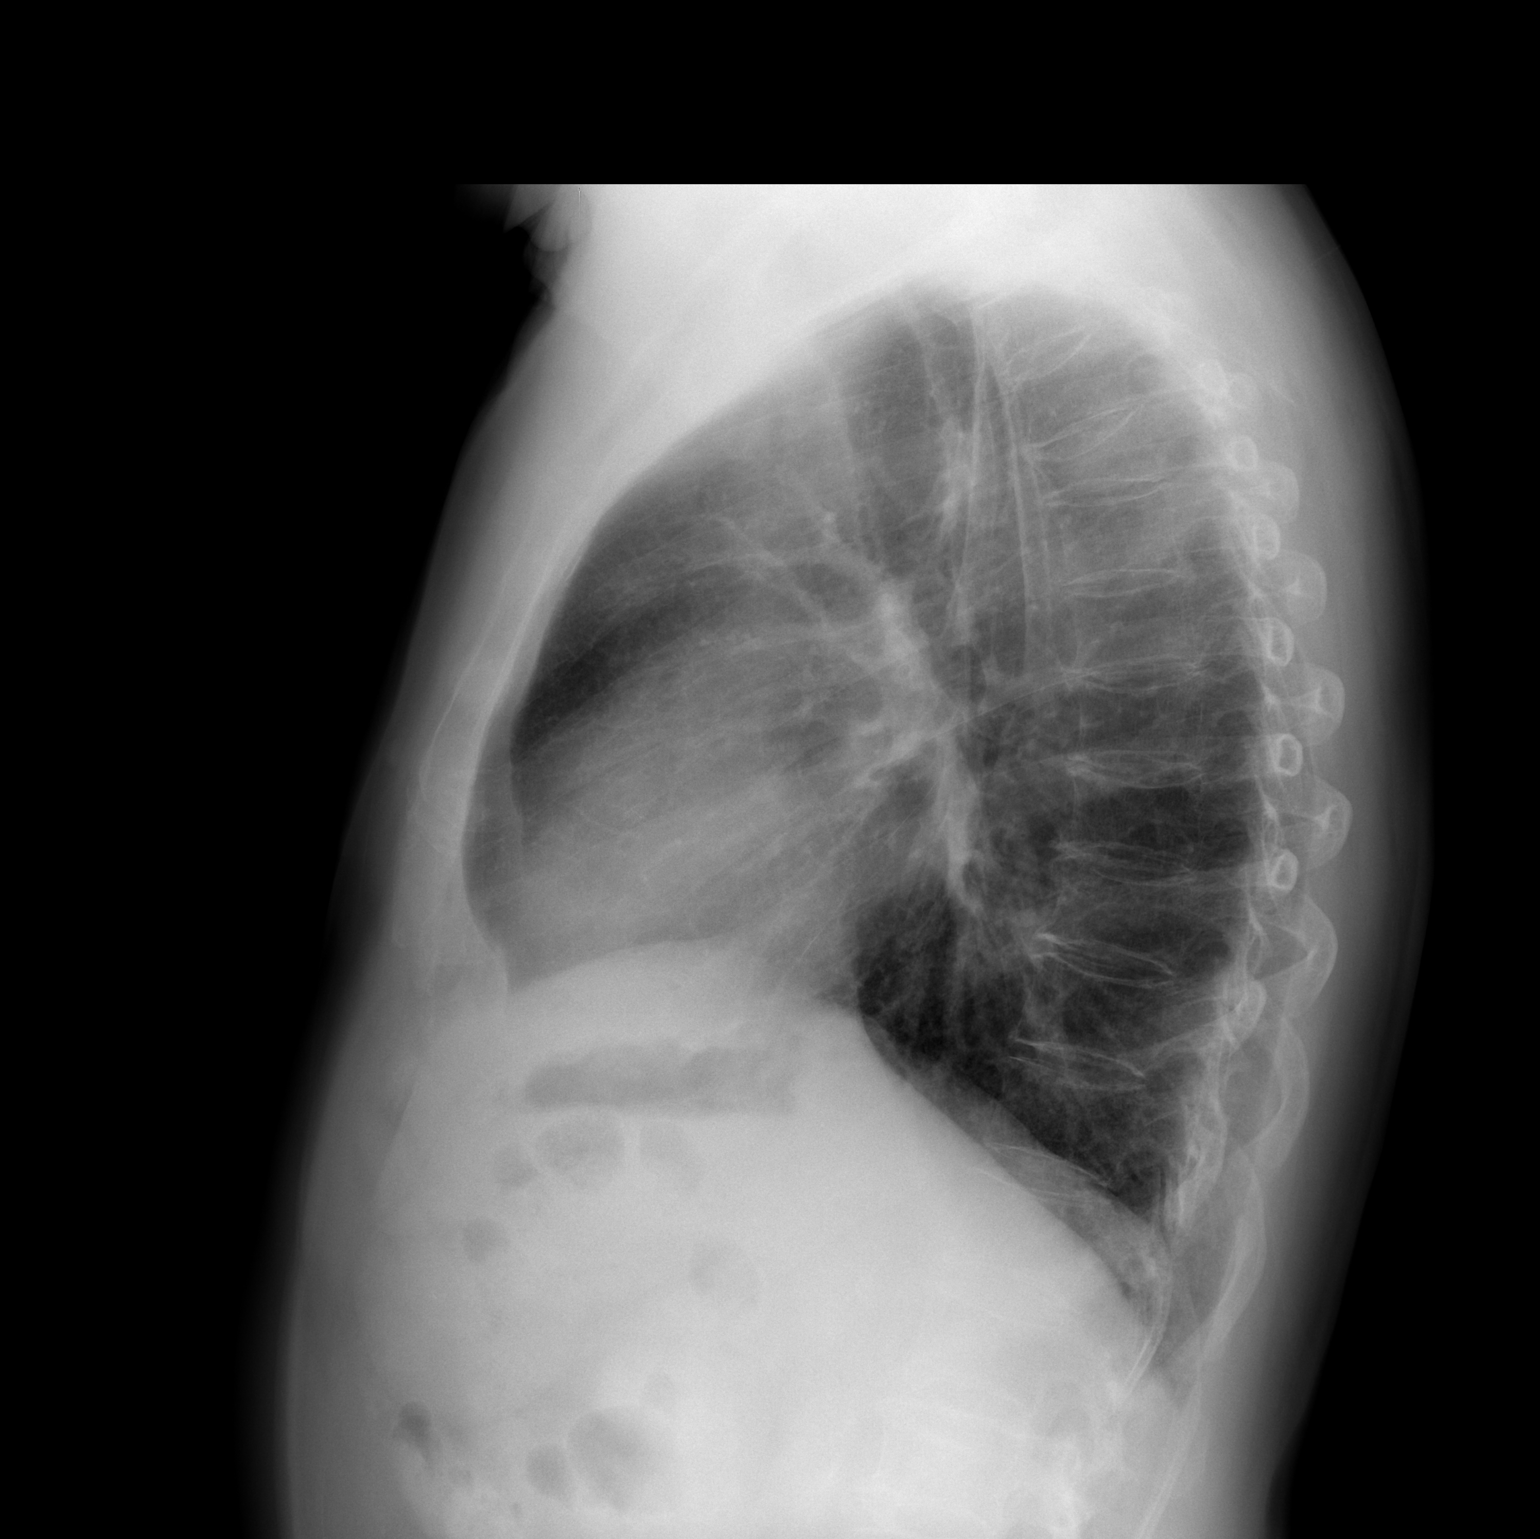

[2 of 2 positions shown; findings below may reference images not displayed]

FINDINGS: The heart size and mediastinal contours are within normal limits.
Both lungs are clear. No pneumothorax or pleural effusion is noted.
Stable compression deformity of upper thoracic vertebral body is
noted.
IMPRESSION: No acute cardiopulmonary abnormality seen.

## 2015-03-31 ENCOUNTER — Telehealth: Payer: Self-pay | Admitting: Interventional Cardiology

## 2015-03-31 ENCOUNTER — Other Ambulatory Visit (INDEPENDENT_AMBULATORY_CARE_PROVIDER_SITE_OTHER): Payer: Medicare Other | Admitting: *Deleted

## 2015-03-31 DIAGNOSIS — E785 Hyperlipidemia, unspecified: Secondary | ICD-10-CM | POA: Diagnosis not present

## 2015-03-31 LAB — LIPID PANEL
CHOLESTEROL: 81 mg/dL (ref 0–200)
HDL: 32.3 mg/dL — AB (ref >39.00)
LDL CALC: 28 mg/dL (ref 0–99)
NonHDL: 49.08
Total CHOL/HDL Ratio: 3
Triglycerides: 104 mg/dL (ref 0.0–149.0)
VLDL: 20.8 mg/dL (ref 0.0–40.0)

## 2015-03-31 LAB — ALT: ALT: 27 U/L (ref 0–53)

## 2015-03-31 NOTE — Telephone Encounter (Signed)
New message    Pt had stroke on July 16 and spent 6 days in hospital @ [facility] in [place]  Pt states he had CT scan, MRI and Echo done while in hospital Pt would like doctor to review this information before his upcoming appointment Please call to discuss

## 2015-03-31 NOTE — Telephone Encounter (Signed)
Returned pt call. lmom Adv pt that Dr.Smith will be able to review his record when he returns to the office next wk. Pt [facility] records are available in care everywhere

## 2015-04-03 NOTE — Progress Notes (Signed)
Cardiology Office Note   Date:  04/05/2015   ID:  JAXON FLATT, DOB Mar 02, 1941, MRN [id number]  PCP:  Lavell Islam, MD  Cardiologist:  [name], MD   Chief Complaint  Patient presents with  . Coronary Artery Disease      History of Present Illness: Tyrone Gonzales is a 74 y.o. male who presents for coronary artery disease with prior bare-metal stent circumflex 1996 and RCA 2003, hypertension, history of PSVT, hyperlipidemia, and recent CVA.  He has not had chest discomfort. He received TPA at [facility] and had complete recovery from right brain symptoms. He has not had any recurrent symptoms. They did not identify a source for right cerebral embolus. We have monitored him on multiple occasions in the past without finding atrial fibrillation. 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 He has been dizzy since his medication regimen has been altered. Imdur was started  Past Medical History  Diagnosis Date  . Hypertension   . Hyperlipidemia   . Coronary artery disease     BMS Cfx.,1996, RCA BMS 2003  . Arrhythmia     PSVT during stress test    Past Surgical History  Procedure Laterality Date  . Left heart catheterization with coronary angiogram N/A 05/11/2014    Procedure: LEFT HEART CATHETERIZATION WITH CORONARY ANGIOGRAM;  Surgeon: [name], MD;  Location: [facility] CATH LAB;  Service: Cardiovascular;  Laterality: N/A;     Current Outpatient Prescriptions  Medication Sig Dispense Refill  . atorvastatin (LIPITOR) 20 MG tablet Take 1 tablet (20 mg total) by mouth daily. 30 tablet 6  . clopidogrel (PLAVIX) 75 MG tablet Take 75 mg by mouth daily.    . finasteride (PROSCAR) 5 MG tablet Take 5 mg by mouth daily.    . isosorbide mononitrate (IMDUR) 30 MG 24 hr tablet Take 30 mg by mouth daily.    [name] losartan (COZAAR) 50 MG tablet Take 25 mg by mouth daily.    . nitroGLYCERIN (NITROSTAT) 0.4 MG SL tablet Place 0.4 mg under the tongue every 5 (five) minutes as needed for chest  pain.    . Omega-3 Fatty Acids (FISH OIL) 1000 MG CAPS Take 2,000 mg by mouth daily.    [name] omeprazole (PRILOSEC) 20 MG capsule Take 20 mg by mouth daily.    [name] OVER THE COUNTER MEDICATION Take 1,000 mg by mouth daily. (CALCIUM)    . OVER THE COUNTER MEDICATION Take 400 mg by mouth daily. (MAGNESIUM)    . OVER THE COUNTER MEDICATION Take 15 mg by mouth daily. (ZINC)    . OVER THE COUNTER MEDICATION Take 2,000 Units by mouth daily. (VITAMIN D3)    . tamsulosin (FLOMAX) 0.4 MG CAPS capsule Take 0.4 mg by mouth 2 (two) times daily.      No current facility-administered medications for this visit.    Allergies:   Bee venom; Dog epithelium; Dust mite extract; Hornet venom; Codeine; Sildenafil; Isovue; and Ivp dye    Social History:  The patient  reports that he has never smoked. He has never used smokeless tobacco.   Family History:  The patient's family history includes Heart attack in his father; Hypertension in his brother, father, and mother; Stroke in his father and mother.    ROS:  Please see the history of present illness.   Otherwise, review of systems are positive for he has some difficulty with urination, difficulty with balance, headaches, easy bruising, and had an episode of syncope 3  days after discharge from the hospital with a CVA.[name]   All other systems are reviewed and negative.    PHYSICAL EXAM: VS:  BP 124/78 mmHg  Pulse 86  Ht 5\' 5"  (1.651 m)  Wt 69.491 kg (153 lb 3.2 oz)  BMI 25.49 kg/m2 , BMI Body mass index is 25.49 kg/(m^2). GEN: Well nourished, well developed, in no acute distress HEENT: normal Neck: no JVD, carotid bruits, or masses Cardiac: RRR; no murmurs, rubs, or gallops,no edema  Respiratory:  clear to auscultation bilaterally, normal work of breathing GI: soft, nontender, nondistended, + BS MS: no deformity or atrophy Skin: warm and dry, no rash Neuro:  Strength and sensation are intact Psych: euthymic mood, full affect   EKG:  EKG is ordered today. The  ekg ordered today demonstrates normal sinus rhythm at 86 bpm with nonspecific ST abnormality    Recent Labs: 05/07/2014: Hemoglobin 14.0; Platelets 177.0 09/22/2014: BUN 20; Creatinine, Ser 1.05; Potassium 4.1; Sodium 138 03/31/2015: ALT 27    Lipid Panel    Component Value Date/Time   CHOL 81 03/31/2015 0748   TRIG 104.0 03/31/2015 0748   HDL 32.30* 03/31/2015 0748   CHOLHDL 3 03/31/2015 0748   VLDL 20.8 03/31/2015 0748   LDLCALC 28 03/31/2015 0748   LDLDIRECT 116.0 09/22/2014 0751      Wt Readings from Last 3 Encounters:  04/05/15 69.491 kg (153 lb 3.2 oz)  09/28/14 72.303 kg (159 lb 6.4 oz)  05/11/14 70.308 kg (155 lb)      Other studies Reviewed: Additional studies/ records that were reviewed today include: . Review of the above records demonstrates: Reviewed voluminous records from [place] .   ASSESSMENT AND PLAN:  1. Atherosclerosis of native coronary artery of native heart without angina pectoris Because of dizziness all discontinue the low-dose isosorbide that was started. Will monitor for any recurrence of chest pain symptoms.  2. Essential hypertension, benign Adequate control  3. Hyperlipidemia Adequate control based upon recent review of laboratory data from recent hospital stay  4. Recent right brain CVA treated with TPA I have some concern that the cerebral embolus was cardiogenic. We have never identified atrial fibrillation. His current antiembolic therapy as Plavix. This was at the direction of neurology at [facility]. We may need to do a 30 day monitor on the patient to exclude atrial fibrillation. If AF is present he will need anticoagulation.   Current medicines are reviewed at length with the patient today.  The patient does not have concerns regarding medicines.  The following changes have been made:  Discontinue isosorbide  Labs/ tests ordered today include: Consider 30 day monitor  No orders of the defined types were placed in this  encounter.     Disposition:   FU with HS in 5 months  Signed, [name], MD  04/05/2015 11:43 AM    [facility] [address] [place], [place], [place]  [address] Phone: [phone]; Fax: [phone]

## 2015-04-05 ENCOUNTER — Telehealth: Payer: Self-pay

## 2015-04-05 ENCOUNTER — Ambulatory Visit (INDEPENDENT_AMBULATORY_CARE_PROVIDER_SITE_OTHER): Payer: Medicare Other | Admitting: Interventional Cardiology

## 2015-04-05 ENCOUNTER — Encounter: Payer: Self-pay | Admitting: Interventional Cardiology

## 2015-04-05 VITALS — BP 124/78 | HR 86 | Ht 65.0 in | Wt 153.2 lb

## 2015-04-05 DIAGNOSIS — I10 Essential (primary) hypertension: Secondary | ICD-10-CM

## 2015-04-05 DIAGNOSIS — I669 Occlusion and stenosis of unspecified cerebral artery: Secondary | ICD-10-CM

## 2015-04-05 DIAGNOSIS — E785 Hyperlipidemia, unspecified: Secondary | ICD-10-CM | POA: Diagnosis not present

## 2015-04-05 DIAGNOSIS — I2510 Atherosclerotic heart disease of native coronary artery without angina pectoris: Secondary | ICD-10-CM

## 2015-04-05 NOTE — Patient Instructions (Signed)
Medication Instructions:  Your physician has recommended you make the following change in your medication:  STOP Imdur  Labwork: None   Testing/Procedures: None   Follow-Up: Your physician wants you to follow-up in: 6 months with Dr.Smith You will receive a reminder letter in the mail two months in advance. If you don't receive a letter, please call our office to schedule the follow-up appointment.   Any Other Special Instructions Will Be Listed Below (If Applicable).

## 2015-04-05 NOTE — Telephone Encounter (Signed)
Spoke with Tyrone Gonzales. Adv Tyrone Gonzales that Dr.Smith is recommending he wear a 30day cardiac monitor. Tyrone Gonzales has had a recent embolic stroke. Dr.Smith is concerned that it could have been cause by his heart. The cardiac monitor would help rule out any arrhythmia.  Tyrone Gonzales is reluctant on wearing a 30day cardiac monitor. Tyrone Gonzales sts that he attempted to wear one several years ago. It was uncomfortable and he sent it back after 1 day.  Tyrone Gonzales willing to attempt to wear one if Dr.Smith feels it would be beneficial. Adv Tyrone Gonzales I will fwd our scheduling dept a message to call him and schedule a monitor appt. Tyrone Gonzales verbalized understanding.

## 2015-04-05 NOTE — Telephone Encounter (Signed)
Lmtcb. Called to give pt Dr.Smith's recommendation. Pt needs to wear a 30 day cardiac monitor. To r/o Afib/Aflutter as the cause of pt recent embolic stroke.

## 2015-04-06 ENCOUNTER — Telehealth: Payer: Self-pay | Admitting: Interventional Cardiology

## 2015-04-06 NOTE — Telephone Encounter (Signed)
Update routed to Dr.Smith 

## 2015-04-06 NOTE — Telephone Encounter (Signed)
I called patient to get him scheduled for his monitor. He informed me that the monitor was a worthless piece of equipment, and that he didn't want to have the monitor.

## 2015-04-07 ENCOUNTER — Telehealth: Payer: Self-pay | Admitting: *Deleted

## 2015-04-07 ENCOUNTER — Other Ambulatory Visit: Payer: Self-pay

## 2015-04-07 DIAGNOSIS — I4891 Unspecified atrial fibrillation: Secondary | ICD-10-CM

## 2015-04-07 DIAGNOSIS — I639 Cerebral infarction, unspecified: Secondary | ICD-10-CM

## 2015-04-07 NOTE — Telephone Encounter (Signed)
Provided information to patient regarding [employer] cardiac monitors. Will have check out contact patient to schedule.

## 2015-04-07 NOTE — Telephone Encounter (Signed)
Attempted to cal pt. Pt phone line busy. Will attempt again later

## 2015-04-07 NOTE — Telephone Encounter (Signed)
The patient refuses prolonged monitoring. I may need to consider a loop recorder. She is to patient with see EP about the possibility of an implant.

## 2015-04-07 NOTE — Telephone Encounter (Signed)
Tell him it will answer whether clot came from heart and whether we need a stronger blood thinner than plavix.

## 2015-04-07 NOTE — Telephone Encounter (Signed)
Pt is refusing to wear a cardiac monitor. He has been called twice to schedule a monitor appt and refuses. I have spoken to the pt twice  in length about the benefit and risk. Shelly our monitor tech has spoken with the pt as well. He sts he "understands the risk" and appreciates Dr.Smith trying to take care of him, but does not want to wear a monitor Update fwd to Dr.Smith

## 2015-04-07 NOTE — Telephone Encounter (Signed)
Called pt. Explained to pt again the importance of wearing the cardiac monitor that has been recommended by Dr.Smith. Dr.Smith would like to r/o his heart as a cause of pt recent embolic stroke. Adv pt that the monitor will help determine if he is having arrhythmia's that could have caused his stroke and possibly another one. Adv pt that if an arrhythmia is detected, he would need to be switch to a anti-coag medication that could significantly lower his risk for stroke.  Pt is still reluctant. He is fixated on the monitor he was ordered to wear several year ago. Adv pt that the newer monitors are smaller, easier to use and a lot less cumbersome. Adv pt that we order them regular and they have been beneficial, they have even saved lives. Pt is appreciative of Dr.Smith's and  Concern, but still is not sure. Asked pt would I can do to make him feel comfortable in proceeding. Pt has questions about the particle monitor he would get, adv pt I will Talk with our monitor tech, maybe she could speak with him and address his questions/concerns. Pt agreed to that. Adv pt she or I  would give him a call back

## 2015-04-13 NOTE — Telephone Encounter (Signed)
Attempted to reach pt. Pt phone line busy. Will attempt again later.

## 2015-04-14 NOTE — Telephone Encounter (Signed)
Called to give pt Dr.Smiths recommendation. lmtcb 

## 2015-04-14 NOTE — Telephone Encounter (Signed)
Pt aware of Dr.Smith's recommendations. Pt sts that he refused to wear 30day monitor, because he contacted his insurance company, and the out of pocket cost would be $1000. He is agreeable with having an EP consult to consider a loop recorder implantation, but is concerned about the out of pocket cost. Adv pt I will talk with one of the EP nurses to get more info on how it is billed. I will call pt back with more info. Pt verbalized understanding.

## 2015-04-15 NOTE — Telephone Encounter (Signed)
Follow Up ° °Pt returned call//  °

## 2015-04-15 NOTE — Telephone Encounter (Signed)
Called to give pt more info.lmtcb  Loop recorder (outpatinet implantation) cpt code: 91478

## 2015-04-15 NOTE — Telephone Encounter (Signed)
Provided pt the cpt code for a loop recorder implantation. Pt will call his insurance to see what the out of pocket cost would be.  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 It is less likely it was embolic.  Adv pt that Dr.Smith is doing his due diligence, and want to r/o his heart as the cause. Pt stated appreciation, he will call back once he has the opportunity to speak with his ins. Co.

## 2015-04-15 NOTE — Telephone Encounter (Signed)
2nd attempt to reach pt. lmtcb 

## 2015-04-18 NOTE — Telephone Encounter (Signed)
Let him know we will continue to follow as we are for now.

## 2015-04-22 ENCOUNTER — Other Ambulatory Visit: Payer: Self-pay

## 2015-04-22 ENCOUNTER — Telehealth: Payer: Self-pay

## 2015-04-22 MED ORDER — LOSARTAN POTASSIUM 25 MG PO TABS: 25.0000 mg | ORAL_TABLET | Freq: Every day | ORAL | Status: AC

## 2015-04-22 MED ORDER — ATORVASTATIN CALCIUM 40 MG PO TABS: 40.0000 mg | ORAL_TABLET | Freq: Every day | ORAL | Status: AC

## 2015-04-22 MED ORDER — FAMOTIDINE 10 MG PO TABS: 10.0000 mg | ORAL_TABLET | Freq: Every day | ORAL | Status: AC

## 2015-04-22 NOTE — Telephone Encounter (Signed)
Pt aware of Dr.Smith's response. Let him know we will continue to follow as we are for now. Pt also wanted Dr.Smith to know that prior to his CVA he was holding his Jonne Ply for 5-7 days, He has a hx of polyps, and was scheduled for  colonoscopy.  Pt voiced appreciation for the call. Update fwd to Dr.Smith

## 2015-04-22 NOTE — Telephone Encounter (Signed)
Called to give pt Dr.Smith's response below. Pt phone line busy. Will attempt again later.

## 2015-04-22 NOTE — Telephone Encounter (Signed)
Received fax from pt with his updated med list. Medications update in Epic

## 2015-04-22 NOTE — Telephone Encounter (Signed)
Called to give pt Dr.Smith's response below. lmtcb 

## 2015-08-14 ENCOUNTER — Encounter: Payer: Self-pay | Admitting: Interventional Cardiology

## 2015-08-14 DIAGNOSIS — I4891 Unspecified atrial fibrillation: Secondary | ICD-10-CM | POA: Insufficient documentation
